# Patient Record
Sex: Male | Born: 1935 | Race: White | Hispanic: No | Smoking: Former smoker
Health system: Southern US, Community
[De-identification: ages and names within clinical notes are randomized; demographics above are authoritative.]

## PROBLEM LIST (undated history)

## (undated) DIAGNOSIS — F32A Depression, unspecified: Secondary | ICD-10-CM

## (undated) DIAGNOSIS — F22 Delusional disorders: Secondary | ICD-10-CM

## (undated) DIAGNOSIS — C4490 Unspecified malignant neoplasm of skin, unspecified: Secondary | ICD-10-CM

## (undated) DIAGNOSIS — F0390 Unspecified dementia without behavioral disturbance: Secondary | ICD-10-CM

## (undated) DIAGNOSIS — F319 Bipolar disorder, unspecified: Secondary | ICD-10-CM

## (undated) DIAGNOSIS — C3490 Malignant neoplasm of unspecified part of unspecified bronchus or lung: Secondary | ICD-10-CM

## (undated) DIAGNOSIS — N189 Chronic kidney disease, unspecified: Secondary | ICD-10-CM

## (undated) DIAGNOSIS — F418 Other specified anxiety disorders: Secondary | ICD-10-CM

## (undated) DIAGNOSIS — N400 Enlarged prostate without lower urinary tract symptoms: Secondary | ICD-10-CM

## (undated) DIAGNOSIS — C229 Malignant neoplasm of liver, not specified as primary or secondary: Secondary | ICD-10-CM

## (undated) DIAGNOSIS — I619 Nontraumatic intracerebral hemorrhage, unspecified: Secondary | ICD-10-CM

## (undated) HISTORY — DX: Malignant neoplasm of unspecified part of unspecified bronchus or lung: C34.90

## (undated) HISTORY — DX: Unspecified dementia, unspecified severity, without behavioral disturbance, psychotic disturbance, mood disturbance, and anxiety: F03.90

## (undated) HISTORY — DX: Unspecified malignant neoplasm of skin, unspecified: C44.90

## (undated) HISTORY — DX: Bipolar disorder, unspecified: F31.9

## (undated) HISTORY — DX: Malignant neoplasm of liver, not specified as primary or secondary: C22.9

## (undated) HISTORY — DX: Nontraumatic intracerebral hemorrhage, unspecified: I61.9

## (undated) HISTORY — PX: APPENDECTOMY: SHX54

## (undated) HISTORY — DX: Other specified anxiety disorders: F41.8

## (undated) HISTORY — DX: Delusional disorders: F22

## (undated) HISTORY — DX: Chronic kidney disease, unspecified: N18.9

## (undated) HISTORY — DX: Depression, unspecified: F32.A

## (undated) HISTORY — DX: Benign prostatic hyperplasia without lower urinary tract symptoms: N40.0

## (undated) HISTORY — PX: TONSILLECTOMY: SUR1361

---

## 2015-07-11 HISTORY — PX: OTHER SURGICAL HISTORY: SHX169

## 2019-09-12 ENCOUNTER — Other Ambulatory Visit: Payer: Self-pay | Admitting: Surgery

## 2019-09-12 DIAGNOSIS — K561 Intussusception: Secondary | ICD-10-CM

## 2019-09-17 ENCOUNTER — Other Ambulatory Visit: Payer: Self-pay | Admitting: Surgery

## 2019-09-17 MED ORDER — PREDNISONE 10 MG PO TABS: 50.0000 mg | ORAL_TABLET | ORAL | 0 refills | Status: DC

## 2019-09-17 MED ORDER — DIPHENHYDRAMINE HCL 50 MG PO TABS: 50.0000 mg | ORAL_TABLET | Freq: Once | ORAL | 0 refills | Status: AC

## 2019-09-18 ENCOUNTER — Other Ambulatory Visit: Payer: Self-pay | Admitting: Student

## 2019-09-18 DIAGNOSIS — K561 Intussusception: Secondary | ICD-10-CM

## 2019-09-25 ENCOUNTER — Other Ambulatory Visit: Payer: Self-pay | Admitting: Surgery

## 2019-09-25 ENCOUNTER — Ambulatory Visit (HOSPITAL_COMMUNITY)
Admission: RE | Admit: 2019-09-25 | Discharge: 2019-09-25 | Disposition: A | Discharge status: Home / Self Care | Payer: Medicare PPO | Source: Ambulatory Visit | Attending: Surgery | Admitting: Surgery

## 2019-09-25 ENCOUNTER — Other Ambulatory Visit: Payer: Self-pay

## 2019-09-25 DIAGNOSIS — K561 Intussusception: Secondary | ICD-10-CM

## 2019-09-25 LAB — POCT I-STAT CREATININE: Creatinine, Ser: 2.2 mg/dL — ABNORMAL HIGH (ref 0.61–1.24)

## 2019-09-25 MED ORDER — IOHEXOL 9 MG/ML PO SOLN
ORAL | Status: AC
  Filled 2019-09-25: qty 1000

## 2020-06-17 ENCOUNTER — Ambulatory Visit: Payer: Medicare PPO | Admitting: Podiatry

## 2020-06-24 ENCOUNTER — Encounter: Payer: Self-pay | Admitting: Podiatry

## 2020-06-24 ENCOUNTER — Ambulatory Visit: Payer: Medicare PPO | Admitting: Podiatry

## 2020-06-24 ENCOUNTER — Other Ambulatory Visit: Payer: Self-pay

## 2020-06-24 DIAGNOSIS — L600 Ingrowing nail: Secondary | ICD-10-CM | POA: Diagnosis not present

## 2020-06-24 MED ORDER — NEOMYCIN-POLYMYXIN-HC 3.5-10000-1 OT SUSP: OTIC | 0 refills | Status: DC

## 2020-06-24 NOTE — Progress Notes (Signed)
  Subjective:  Patient ID: George Cross, male    DOB: [birth year],  MRN: [id number]  Chief Complaint  Patient presents with  . Nail Problem       (NP)INGROWN TOENAIL; BIL//RFC    86 y.o. male presents with the above complaint. History confirmed with patient.  He has had ingrown toenails and often gets them treated by pedicures.  He is here with his daughter today.  The right lateral and left medial and lateral borders are the most bothersome.  Previously had a right medial matricectomy by another podiatrist and Saul Fordyce which solve the problem.  Objective:  Physical Exam: warm, good capillary refill, no trophic changes or ulcerative lesions, normal DP and PT pulses and normal sensory exam.  Varicose veins present.  Ingrowing nail borders of right lateral and left medial and lateral borders.  No paronychia  Assessment:  No diagnosis found.   Plan:  Patient was evaluated and treated and all questions answered.    Ingrown Nail, bilaterally -Patient elects to proceed with minor surgery to remove ingrown toenail today. Consent reviewed and signed by patient. -Ingrown nail excised. See procedure note. -Educated on post-procedure care including soaking. Written instructions provided and reviewed. -Patient to follow up in 2 weeks for nail check.  Procedure: Excision of Ingrown Toenail Location: Bilateral 1st toe left medial and right medial and lateral nail borders. Anesthesia: Lidocaine 1% plain; 1.5 mL and Marcaine 0.5% plain; 1.5 mL, digital block. Skin Prep: Betadine. Dressing: Silvadene; telfa; dry, sterile, compression dressing. Technique: Following skin prep, the toe was exsanguinated and a tourniquet was secured at the base of the toe. The affected nail border was freed, split with a nail splitter, and excised. Chemical matrixectomy was then performed with phenol and irrigated out with alcohol. The tourniquet was then removed and sterile dressing applied. Disposition: Patient  tolerated procedure well. Patient to return in 2 weeks for follow-up.     Return in about 2 weeks (around 07/08/2020) for nail check bilateral.

## 2020-06-24 NOTE — Patient Instructions (Signed)

## 2020-07-15 ENCOUNTER — Other Ambulatory Visit: Payer: Self-pay

## 2020-07-15 ENCOUNTER — Ambulatory Visit (INDEPENDENT_AMBULATORY_CARE_PROVIDER_SITE_OTHER): Payer: Medicare PPO | Admitting: Podiatry

## 2020-07-15 ENCOUNTER — Encounter: Payer: Self-pay | Admitting: Podiatry

## 2020-07-15 DIAGNOSIS — L600 Ingrowing nail: Secondary | ICD-10-CM

## 2020-07-15 DIAGNOSIS — L539 Erythematous condition, unspecified: Secondary | ICD-10-CM | POA: Diagnosis not present

## 2020-07-15 MED ORDER — CEFADROXIL 500 MG PO CAPS: 500.0000 mg | ORAL_CAPSULE | Freq: Two times a day (BID) | ORAL | 0 refills | Status: AC

## 2020-07-15 NOTE — Progress Notes (Signed)
  Subjective:  Patient ID: George Cross, male    DOB: 1923-06-19,  MRN: [id number]  Chief Complaint  Patient presents with  . Nail Problem    Bilateral ingrown nail. Left hallux has a little redness.     87 y.o. male presents with the above complaint. History confirmed with patient.  Status post permanent partial nail matricectomy's left bilateral and right lateral borders hallux.  Having some redness persistent to the level of the MTPJ and the left  Objective:  Physical Exam: warm, good capillary refill, no trophic changes or ulcerative lesions, normal DP and PT pulses and normal sensory exam.  Varicose veins present.  Right side is healing well.  There is persistent erythema to the level of the MTPJ on the left side.  No purulent drainage.  No malodor.  No recurrent ingrown.  Assessment:  No diagnosis found.   Plan:  Patient was evaluated and treated and all questions answered.    Ingrown Nail, bilaterally -Suspect the majority of this is persistent reaction to phenol.  Advised to continue soaking and Cortisporin ointment for 1 more week.  I do think it be helpful to have short course of antibiotics.  Duricef x5 days was sent to his pharmacy.  I will see him in 4 weeks to reevaluate if they are still having issues   No follow-ups on file.

## 2020-08-12 ENCOUNTER — Ambulatory Visit: Payer: Medicare PPO | Admitting: Podiatry

## 2020-08-30 DIAGNOSIS — F319 Bipolar disorder, unspecified: Secondary | ICD-10-CM | POA: Diagnosis not present

## 2020-08-30 DIAGNOSIS — R350 Frequency of micturition: Secondary | ICD-10-CM | POA: Diagnosis not present

## 2020-08-30 DIAGNOSIS — L309 Dermatitis, unspecified: Secondary | ICD-10-CM | POA: Diagnosis not present

## 2020-08-30 DIAGNOSIS — F688 Other specified disorders of adult personality and behavior: Secondary | ICD-10-CM | POA: Diagnosis not present

## 2020-09-13 DIAGNOSIS — F319 Bipolar disorder, unspecified: Secondary | ICD-10-CM | POA: Diagnosis not present

## 2020-09-13 DIAGNOSIS — F418 Other specified anxiety disorders: Secondary | ICD-10-CM | POA: Diagnosis not present

## 2020-09-13 DIAGNOSIS — M545 Low back pain, unspecified: Secondary | ICD-10-CM | POA: Diagnosis not present

## 2020-09-13 DIAGNOSIS — F688 Other specified disorders of adult personality and behavior: Secondary | ICD-10-CM | POA: Diagnosis not present

## 2020-09-13 DIAGNOSIS — R4182 Altered mental status, unspecified: Secondary | ICD-10-CM | POA: Diagnosis not present

## 2020-09-14 ENCOUNTER — Encounter: Payer: Self-pay | Admitting: *Deleted

## 2020-09-14 ENCOUNTER — Other Ambulatory Visit: Payer: Self-pay | Admitting: Family Medicine

## 2020-09-14 ENCOUNTER — Ambulatory Visit
Admission: RE | Admit: 2020-09-14 | Discharge: 2020-09-14 | Disposition: A | Discharge status: Home / Self Care | Payer: Medicare PPO | Source: Ambulatory Visit | Attending: Family Medicine | Admitting: Family Medicine

## 2020-09-14 ENCOUNTER — Other Ambulatory Visit: Payer: Self-pay

## 2020-09-14 DIAGNOSIS — Z8659 Personal history of other mental and behavioral disorders: Secondary | ICD-10-CM

## 2020-09-14 DIAGNOSIS — R443 Hallucinations, unspecified: Secondary | ICD-10-CM | POA: Diagnosis not present

## 2020-09-15 ENCOUNTER — Ambulatory Visit: Payer: Medicare PPO | Admitting: Diagnostic Neuroimaging

## 2020-09-15 ENCOUNTER — Encounter: Payer: Self-pay | Admitting: Diagnostic Neuroimaging

## 2020-09-15 VITALS — BP 103/56 | HR 95 | Ht 70.5 in | Wt 132.0 lb

## 2020-09-15 DIAGNOSIS — F0390 Unspecified dementia without behavioral disturbance: Secondary | ICD-10-CM

## 2020-09-15 DIAGNOSIS — F3164 Bipolar disorder, current episode mixed, severe, with psychotic features: Secondary | ICD-10-CM

## 2020-09-15 DIAGNOSIS — F03A0 Unspecified dementia, mild, without behavioral disturbance, psychotic disturbance, mood disturbance, and anxiety: Secondary | ICD-10-CM

## 2020-09-15 NOTE — Progress Notes (Signed)
[facility]  PATIENT: George Cross DOB: 1922/11/07  REFERRING CLINICIAN: London Pepper, MD HISTORY FROM: patient, daughter REASON FOR VISIT: new consult    HISTORICAL  CHIEF COMPLAINT:  Chief Complaint  Patient presents with  . Dementia    Rm 6 New Pt dgtr- Sandra/ HC-POA  MMSE 18    HISTORY OF PRESENT ILLNESS:   87 year old male here for evaluation of confusion, paranoia, memory loss, agitation, anxiety.  Patient has long history of bipolar disorder, with significant periods of mania and some periods of dysthymia and depression.  He was managed medically for many years by psychiatry with good control.  In 2016 patient had a fall resulting in subdural hematoma.  Medications were reviewed and polypharmacy was thought to be a cause.  Around that time he was weaned off of his psychiatry medications for bipolar disorder.  He did well for several years until about a year ago.  2021 patient had some cognitive and physical decline, having more trouble maintaining his ADLs outside of the home.  Patient moved closer to his daughter and moved into independent living [place] states.  In spite of this decline he was still doing well managing his medications and living independently with support from his family.  3 to 4 weeks ago he met a "girlfriend" where he lives and which led to some physical intimacy; however, something apparently happened and since that time patient has a felt very guilty and embarrassed about the situation.  This seems to have triggered his bipolar disorder significantly.  He has having high levels of paranoia, anxiety, confusion and agitation.  He went to PCP for evaluation.  He was referred to psychiatry and neurology.    REVIEW OF SYSTEMS: Full 14 system review of systems performed and negative with exception of: as per HPI.  ALLERGIES: Allergies  Allergen Reactions  . Sulfa Antibiotics   . Sulfamethoxazole   . Elemental Sulfur Rash     HOME MEDICATIONS: Outpatient Medications Prior to Visit  Medication Sig Dispense Refill  . diphenhydrAMINE (BENADRYL) 50 MG tablet Take 1 tablet (50 mg total) by mouth once for 1 dose. Take 1 tablet one hour before the CT scan 1 tablet 0  . LORazepam (ATIVAN) 1 MG tablet Take by mouth. (Patient not taking: Reported on 09/15/2020)    . neomycin-polymyxin-hydrocortisone (CORTISPORIN) 3.5-10000-1 OTIC suspension Apply 1-2 drops daily after soaking and cover with bandaid (Patient not taking: Reported on 09/15/2020) 10 mL 0  . predniSONE (DELTASONE) 10 MG tablet Take 5 tablets (50 mg total) by mouth as directed. Take 5 tablets 13 hours before the procedure, 7 hours before the procedure, and 1 hour before the procedure. 15 tablet 0   No facility-administered medications prior to visit.    PAST MEDICAL HISTORY: Past Medical History:  Diagnosis Date  . Bipolar 1 disorder ([place])   . BPH (benign prostatic hyperplasia)   . Cerebral hemorrhage ([place])   . CKD (chronic kidney disease)   . Dementia ([place])   . Depression   . Liver cancer ([place])   . Lung cancer ([place])   . Paranoid ([place])   . Situational anxiety   . Skin cancer    basal cell    PAST SURGICAL HISTORY: Past Surgical History:  Procedure Laterality Date  . APPENDECTOMY    . head surgery  2017   to release fluid in head s/p fall  . TONSILLECTOMY      FAMILY HISTORY: Family History  Problem Relation Age of Onset  .  Alzheimer's disease Sister     SOCIAL HISTORY: Social History   Socioeconomic History  . Marital status: Divorced    Spouse name: Not on file  . Number of children: 2  . Years of education: Not on file  . Highest education level: Master's degree (e.g., MA, MS, MEng, MEd, MSW, MBA)  Occupational History    Comment: retired  Tobacco Use  . Smoking status: Former Smoker    Quit date: 09/15/2010    Years since quitting: 10.0  . Smokeless tobacco: Never Used  Substance and Sexual Activity  . Alcohol use: Not  Currently  . Drug use: Never  . Sexual activity: Not on file  Other Topics Concern  . Not on file  Social History Narrative   09/14/20 lives at [facility], independent living   Social Determinants of Health   Financial Resource Strain: Not on file  Food Insecurity: Not on file  Transportation Needs: Not on file  Physical Activity: Not on file  Stress: Not on file  Social Connections: Not on file  Intimate Partner Violence: Not on file     PHYSICAL EXAM  GENERAL EXAM/CONSTITUTIONAL: Vitals:  Vitals:   09/15/20 1035  BP: (!) 103/56  Pulse: 95  Weight: 132 lb (59.9 kg)  Height: 5' 10.5" (1.791 m)     Body mass index is 18.67 kg/m. Wt Readings from Last 3 Encounters:  09/15/20 132 lb (59.9 kg)     Patient is in no distress; well developed, nourished and groomed; neck is supple  CARDIOVASCULAR:  Examination of carotid arteries is normal; no carotid bruits  Regular rate and rhythm, no murmurs  Examination of peripheral vascular system by observation and palpation is normal  EYES:  Ophthalmoscopic exam of optic discs and posterior segments is normal; no papilledema or hemorrhages  No exam data present  MUSCULOSKELETAL:  Gait, strength, tone, movements noted in Neurologic exam below  NEUROLOGIC: MENTAL STATUS:  MMSE - [place] Exam 09/15/2020  Orientation to time 3  Orientation to Place 3  Registration 3  Attention/ Calculation 2  Recall 0  Language- name 2 objects 2  Language- repeat 0  Language- follow 3 step command 3  Language- read & follow direction 1  Write a sentence 1  Copy design 0  Total score 18    awake, alert, oriented to person  [place] attention and concentration  language fluent, comprehension intact, naming intact  fund of knowledge appropriate  ANXIOUS, TANGENTIAL, RESTLESS  CRANIAL NERVE:   2nd - no papilledema on fundoscopic exam  2nd, 3rd, 4th, 6th - pupils equal and reactive to light,  visual fields full to confrontation, extraocular muscles intact, no nystagmus  5th - facial sensation symmetric  7th - facial strength symmetric  8th - hearing intact  9th - palate elevates symmetrically, uvula midline  11th - shoulder shrug symmetric  12th - tongue protrusion midline  MOTOR:   normal bulk and tone, DIFFUSE 4+ strength in the BUE, BLE  SENSORY:   normal and symmetric to light touch, temperature, vibration  COORDINATION:   finger-nose-finger, fine finger movements SLOW   REFLEXES:   deep tendon reflexes 1+ and symmetric  GAIT/STATION:   narrow based gait; USING WALKER     DIAGNOSTIC DATA (LABS, IMAGING, TESTING) - I reviewed patient records, labs, notes, testing and imaging myself where available.  No results found for: WBC, HGB, HCT, MCV, PLT    Component Value Date/Time   CREATININE 2.20 (H) 09/25/2019  0846   No results found for: CHOL, HDL, LDLCALC, LDLDIRECT, TRIG, CHOLHDL No results found for: HGBA1C No results found for: VITAMINB12 No results found for: TSH   09/15/2020 CT head [I reviewed images myself and agree with interpretation. -VRP] - No acute abnormality. Atrophy and chronic microvascular ischemic change in the white matter. Bilateral craniotomy.     ASSESSMENT AND PLAN  87 y.o. year old male here with long history of bipolar disorder, previously managed medically, now off medications for several years, now in exacerbation due to situational stress about 3 to 4 weeks ago.  Also with 1 year of gradual onset and progression of cognitive and ADL decline, raising possibility of underlying mild dementia.  Dx:  1. Bipolar disorder, current episode mixed, severe, with psychotic features ([place])   2. Mild dementia ([place])      PLAN:  BIPOLAR DISORDER (main current active issue; since Feb 2022) - follow up with psychiatry (sxs exacerbated in last 3-4 weeks due to situational stress) - may need to restart prior bipolar  medications   MEMORY / COGNITIVE DECLINE (gradual onset since 2021; MMSE 18/30; decline in ADLs) - suspect onset of mild neurodegenerative dementia (since at least 2021) - safety / supervision issues reviewed; may need higher level of care in the coming weeks / months - daily physical activity / exercise (at least 15-30 minutes) - eat more plants / vegetables - increase social activities, brain stimulation, games, puzzles, hobbies, crafts, arts, music - aim for at least 7-8 hours sleep per night (or more) - avoid smoking and alcohol - caregiver resources provided - caution with medications, finances, living alone  Return for pending if symptoms worsen or fail to improve.    Penni Bombard, MD 09/14/9442, 61:90 AM Certified in Neurology, Neurophysiology and Neuroimaging  [facility] [address] [place], [place] [address] [phone]

## 2020-09-15 NOTE — Patient Instructions (Signed)
MEMORY / COGNITIVE DECLINE (MMSE 18/30; decline in ADLs) - suspect onset of dementia (since at least 2021) - safety / supervision issues reviewed - daily physical activity / exercise (at least 15-30 minutes) - eat more plants / vegetables - increase social activities, brain stimulation, games, puzzles, hobbies, crafts, arts, music - aim for at least 7-8 hours sleep per night (or more) - avoid smoking and alcohol - caution with medications, finances, living alone  BIPOLAR DISORDER - follow up with psychiatry (worsened in last 3-4 weeks due to situational stress)

## 2020-10-26 DIAGNOSIS — E08311 Diabetes mellitus due to underlying condition with unspecified diabetic retinopathy with macular edema: Secondary | ICD-10-CM | POA: Diagnosis not present

## 2020-10-26 DIAGNOSIS — E559 Vitamin D deficiency, unspecified: Secondary | ICD-10-CM | POA: Diagnosis not present

## 2020-10-26 DIAGNOSIS — D519 Vitamin B12 deficiency anemia, unspecified: Secondary | ICD-10-CM | POA: Diagnosis not present

## 2020-10-26 DIAGNOSIS — R4182 Altered mental status, unspecified: Secondary | ICD-10-CM | POA: Diagnosis not present

## 2020-10-26 DIAGNOSIS — D508 Other iron deficiency anemias: Secondary | ICD-10-CM | POA: Diagnosis not present

## 2020-10-26 DIAGNOSIS — R972 Elevated prostate specific antigen [PSA]: Secondary | ICD-10-CM | POA: Diagnosis not present

## 2020-10-26 DIAGNOSIS — D518 Other vitamin B12 deficiency anemias: Secondary | ICD-10-CM | POA: Diagnosis not present

## 2020-10-26 DIAGNOSIS — D509 Iron deficiency anemia, unspecified: Secondary | ICD-10-CM | POA: Diagnosis not present

## 2020-10-28 DIAGNOSIS — G255 Other chorea: Secondary | ICD-10-CM | POA: Diagnosis not present

## 2020-10-28 DIAGNOSIS — F418 Other specified anxiety disorders: Secondary | ICD-10-CM | POA: Diagnosis not present

## 2020-10-28 DIAGNOSIS — N400 Enlarged prostate without lower urinary tract symptoms: Secondary | ICD-10-CM | POA: Diagnosis not present

## 2020-10-28 DIAGNOSIS — H3589 Other specified retinal disorders: Secondary | ICD-10-CM | POA: Diagnosis not present

## 2020-10-28 DIAGNOSIS — N3281 Overactive bladder: Secondary | ICD-10-CM | POA: Diagnosis not present

## 2020-10-28 DIAGNOSIS — F1011 Alcohol abuse, in remission: Secondary | ICD-10-CM | POA: Diagnosis not present

## 2020-10-28 DIAGNOSIS — R634 Abnormal weight loss: Secondary | ICD-10-CM | POA: Diagnosis not present

## 2020-10-28 DIAGNOSIS — N189 Chronic kidney disease, unspecified: Secondary | ICD-10-CM | POA: Diagnosis not present

## 2020-10-28 DIAGNOSIS — Z9989 Dependence on other enabling machines and devices: Secondary | ICD-10-CM | POA: Diagnosis not present

## 2020-11-04 DIAGNOSIS — E568 Deficiency of other vitamins: Secondary | ICD-10-CM | POA: Diagnosis not present

## 2020-11-04 DIAGNOSIS — F413 Other mixed anxiety disorders: Secondary | ICD-10-CM | POA: Diagnosis not present

## 2020-11-04 DIAGNOSIS — F1011 Alcohol abuse, in remission: Secondary | ICD-10-CM | POA: Diagnosis not present

## 2020-11-04 DIAGNOSIS — R634 Abnormal weight loss: Secondary | ICD-10-CM | POA: Diagnosis not present

## 2020-11-04 DIAGNOSIS — F32A Depression, unspecified: Secondary | ICD-10-CM | POA: Diagnosis not present

## 2020-11-04 DIAGNOSIS — F3189 Other bipolar disorder: Secondary | ICD-10-CM | POA: Diagnosis not present

## 2020-11-04 DIAGNOSIS — N400 Enlarged prostate without lower urinary tract symptoms: Secondary | ICD-10-CM | POA: Diagnosis not present

## 2020-11-04 DIAGNOSIS — F418 Other specified anxiety disorders: Secondary | ICD-10-CM | POA: Diagnosis not present

## 2020-11-05 DIAGNOSIS — N400 Enlarged prostate without lower urinary tract symptoms: Secondary | ICD-10-CM | POA: Diagnosis not present

## 2020-11-05 DIAGNOSIS — F419 Anxiety disorder, unspecified: Secondary | ICD-10-CM | POA: Diagnosis not present

## 2020-11-05 DIAGNOSIS — H3530 Unspecified macular degeneration: Secondary | ICD-10-CM | POA: Diagnosis not present

## 2020-11-05 DIAGNOSIS — R634 Abnormal weight loss: Secondary | ICD-10-CM | POA: Diagnosis not present

## 2020-11-05 DIAGNOSIS — N3281 Overactive bladder: Secondary | ICD-10-CM | POA: Diagnosis not present

## 2020-11-05 DIAGNOSIS — Z9181 History of falling: Secondary | ICD-10-CM | POA: Diagnosis not present

## 2020-11-05 DIAGNOSIS — M170 Bilateral primary osteoarthritis of knee: Secondary | ICD-10-CM | POA: Diagnosis not present

## 2020-11-05 DIAGNOSIS — Z681 Body mass index (BMI) 19 or less, adult: Secondary | ICD-10-CM | POA: Diagnosis not present

## 2020-11-05 DIAGNOSIS — F319 Bipolar disorder, unspecified: Secondary | ICD-10-CM | POA: Diagnosis not present

## 2020-11-09 DIAGNOSIS — Z681 Body mass index (BMI) 19 or less, adult: Secondary | ICD-10-CM | POA: Diagnosis not present

## 2020-11-09 DIAGNOSIS — M170 Bilateral primary osteoarthritis of knee: Secondary | ICD-10-CM | POA: Diagnosis not present

## 2020-11-09 DIAGNOSIS — F419 Anxiety disorder, unspecified: Secondary | ICD-10-CM | POA: Diagnosis not present

## 2020-11-09 DIAGNOSIS — R634 Abnormal weight loss: Secondary | ICD-10-CM | POA: Diagnosis not present

## 2020-11-09 DIAGNOSIS — Z9181 History of falling: Secondary | ICD-10-CM | POA: Diagnosis not present

## 2020-11-09 DIAGNOSIS — N400 Enlarged prostate without lower urinary tract symptoms: Secondary | ICD-10-CM | POA: Diagnosis not present

## 2020-11-09 DIAGNOSIS — N3281 Overactive bladder: Secondary | ICD-10-CM | POA: Diagnosis not present

## 2020-11-09 DIAGNOSIS — H3530 Unspecified macular degeneration: Secondary | ICD-10-CM | POA: Diagnosis not present

## 2020-11-09 DIAGNOSIS — F319 Bipolar disorder, unspecified: Secondary | ICD-10-CM | POA: Diagnosis not present

## 2020-11-11 DIAGNOSIS — R634 Abnormal weight loss: Secondary | ICD-10-CM | POA: Diagnosis not present

## 2020-11-11 DIAGNOSIS — N3281 Overactive bladder: Secondary | ICD-10-CM | POA: Diagnosis not present

## 2020-11-11 DIAGNOSIS — M170 Bilateral primary osteoarthritis of knee: Secondary | ICD-10-CM | POA: Diagnosis not present

## 2020-11-11 DIAGNOSIS — Z681 Body mass index (BMI) 19 or less, adult: Secondary | ICD-10-CM | POA: Diagnosis not present

## 2020-11-11 DIAGNOSIS — F419 Anxiety disorder, unspecified: Secondary | ICD-10-CM | POA: Diagnosis not present

## 2020-11-11 DIAGNOSIS — N400 Enlarged prostate without lower urinary tract symptoms: Secondary | ICD-10-CM | POA: Diagnosis not present

## 2020-11-11 DIAGNOSIS — H3530 Unspecified macular degeneration: Secondary | ICD-10-CM | POA: Diagnosis not present

## 2020-11-11 DIAGNOSIS — Z9189 Other specified personal risk factors, not elsewhere classified: Secondary | ICD-10-CM | POA: Diagnosis not present

## 2020-11-11 DIAGNOSIS — F418 Other specified anxiety disorders: Secondary | ICD-10-CM | POA: Diagnosis not present

## 2020-11-11 DIAGNOSIS — F319 Bipolar disorder, unspecified: Secondary | ICD-10-CM | POA: Diagnosis not present

## 2020-11-11 DIAGNOSIS — R143 Flatulence: Secondary | ICD-10-CM | POA: Diagnosis not present

## 2020-11-11 DIAGNOSIS — Z9181 History of falling: Secondary | ICD-10-CM | POA: Diagnosis not present

## 2020-11-16 DIAGNOSIS — Z9181 History of falling: Secondary | ICD-10-CM | POA: Diagnosis not present

## 2020-11-16 DIAGNOSIS — R634 Abnormal weight loss: Secondary | ICD-10-CM | POA: Diagnosis not present

## 2020-11-16 DIAGNOSIS — N3281 Overactive bladder: Secondary | ICD-10-CM | POA: Diagnosis not present

## 2020-11-16 DIAGNOSIS — F319 Bipolar disorder, unspecified: Secondary | ICD-10-CM | POA: Diagnosis not present

## 2020-11-16 DIAGNOSIS — M170 Bilateral primary osteoarthritis of knee: Secondary | ICD-10-CM | POA: Diagnosis not present

## 2020-11-16 DIAGNOSIS — Z681 Body mass index (BMI) 19 or less, adult: Secondary | ICD-10-CM | POA: Diagnosis not present

## 2020-11-16 DIAGNOSIS — H3530 Unspecified macular degeneration: Secondary | ICD-10-CM | POA: Diagnosis not present

## 2020-11-16 DIAGNOSIS — F419 Anxiety disorder, unspecified: Secondary | ICD-10-CM | POA: Diagnosis not present

## 2020-11-16 DIAGNOSIS — N400 Enlarged prostate without lower urinary tract symptoms: Secondary | ICD-10-CM | POA: Diagnosis not present

## 2020-11-17 DIAGNOSIS — Z9181 History of falling: Secondary | ICD-10-CM | POA: Diagnosis not present

## 2020-11-17 DIAGNOSIS — Z681 Body mass index (BMI) 19 or less, adult: Secondary | ICD-10-CM | POA: Diagnosis not present

## 2020-11-17 DIAGNOSIS — H3530 Unspecified macular degeneration: Secondary | ICD-10-CM | POA: Diagnosis not present

## 2020-11-17 DIAGNOSIS — N400 Enlarged prostate without lower urinary tract symptoms: Secondary | ICD-10-CM | POA: Diagnosis not present

## 2020-11-17 DIAGNOSIS — F419 Anxiety disorder, unspecified: Secondary | ICD-10-CM | POA: Diagnosis not present

## 2020-11-17 DIAGNOSIS — F319 Bipolar disorder, unspecified: Secondary | ICD-10-CM | POA: Diagnosis not present

## 2020-11-17 DIAGNOSIS — R634 Abnormal weight loss: Secondary | ICD-10-CM | POA: Diagnosis not present

## 2020-11-17 DIAGNOSIS — M170 Bilateral primary osteoarthritis of knee: Secondary | ICD-10-CM | POA: Diagnosis not present

## 2020-11-17 DIAGNOSIS — N3281 Overactive bladder: Secondary | ICD-10-CM | POA: Diagnosis not present

## 2020-11-18 DIAGNOSIS — Z9181 History of falling: Secondary | ICD-10-CM | POA: Diagnosis not present

## 2020-11-18 DIAGNOSIS — N400 Enlarged prostate without lower urinary tract symptoms: Secondary | ICD-10-CM | POA: Diagnosis not present

## 2020-11-18 DIAGNOSIS — R634 Abnormal weight loss: Secondary | ICD-10-CM | POA: Diagnosis not present

## 2020-11-18 DIAGNOSIS — F419 Anxiety disorder, unspecified: Secondary | ICD-10-CM | POA: Diagnosis not present

## 2020-11-18 DIAGNOSIS — N3281 Overactive bladder: Secondary | ICD-10-CM | POA: Diagnosis not present

## 2020-11-18 DIAGNOSIS — F319 Bipolar disorder, unspecified: Secondary | ICD-10-CM | POA: Diagnosis not present

## 2020-11-18 DIAGNOSIS — H3530 Unspecified macular degeneration: Secondary | ICD-10-CM | POA: Diagnosis not present

## 2020-11-18 DIAGNOSIS — M170 Bilateral primary osteoarthritis of knee: Secondary | ICD-10-CM | POA: Diagnosis not present

## 2020-11-18 DIAGNOSIS — Z681 Body mass index (BMI) 19 or less, adult: Secondary | ICD-10-CM | POA: Diagnosis not present

## 2020-11-23 DIAGNOSIS — N3281 Overactive bladder: Secondary | ICD-10-CM | POA: Diagnosis not present

## 2020-11-23 DIAGNOSIS — F319 Bipolar disorder, unspecified: Secondary | ICD-10-CM | POA: Diagnosis not present

## 2020-11-23 DIAGNOSIS — H3530 Unspecified macular degeneration: Secondary | ICD-10-CM | POA: Diagnosis not present

## 2020-11-23 DIAGNOSIS — M170 Bilateral primary osteoarthritis of knee: Secondary | ICD-10-CM | POA: Diagnosis not present

## 2020-11-23 DIAGNOSIS — F419 Anxiety disorder, unspecified: Secondary | ICD-10-CM | POA: Diagnosis not present

## 2020-11-23 DIAGNOSIS — Z9181 History of falling: Secondary | ICD-10-CM | POA: Diagnosis not present

## 2020-11-23 DIAGNOSIS — N400 Enlarged prostate without lower urinary tract symptoms: Secondary | ICD-10-CM | POA: Diagnosis not present

## 2020-11-23 DIAGNOSIS — Z681 Body mass index (BMI) 19 or less, adult: Secondary | ICD-10-CM | POA: Diagnosis not present

## 2020-11-23 DIAGNOSIS — R634 Abnormal weight loss: Secondary | ICD-10-CM | POA: Diagnosis not present

## 2020-11-24 DIAGNOSIS — N3281 Overactive bladder: Secondary | ICD-10-CM | POA: Diagnosis not present

## 2020-11-24 DIAGNOSIS — R634 Abnormal weight loss: Secondary | ICD-10-CM | POA: Diagnosis not present

## 2020-11-24 DIAGNOSIS — Z9181 History of falling: Secondary | ICD-10-CM | POA: Diagnosis not present

## 2020-11-24 DIAGNOSIS — N400 Enlarged prostate without lower urinary tract symptoms: Secondary | ICD-10-CM | POA: Diagnosis not present

## 2020-11-24 DIAGNOSIS — F419 Anxiety disorder, unspecified: Secondary | ICD-10-CM | POA: Diagnosis not present

## 2020-11-24 DIAGNOSIS — M170 Bilateral primary osteoarthritis of knee: Secondary | ICD-10-CM | POA: Diagnosis not present

## 2020-11-24 DIAGNOSIS — Z681 Body mass index (BMI) 19 or less, adult: Secondary | ICD-10-CM | POA: Diagnosis not present

## 2020-11-24 DIAGNOSIS — F319 Bipolar disorder, unspecified: Secondary | ICD-10-CM | POA: Diagnosis not present

## 2020-11-24 DIAGNOSIS — H3530 Unspecified macular degeneration: Secondary | ICD-10-CM | POA: Diagnosis not present

## 2020-11-25 DIAGNOSIS — N400 Enlarged prostate without lower urinary tract symptoms: Secondary | ICD-10-CM | POA: Diagnosis not present

## 2020-11-25 DIAGNOSIS — Z681 Body mass index (BMI) 19 or less, adult: Secondary | ICD-10-CM | POA: Diagnosis not present

## 2020-11-25 DIAGNOSIS — F419 Anxiety disorder, unspecified: Secondary | ICD-10-CM | POA: Diagnosis not present

## 2020-11-25 DIAGNOSIS — M170 Bilateral primary osteoarthritis of knee: Secondary | ICD-10-CM | POA: Diagnosis not present

## 2020-11-25 DIAGNOSIS — N3281 Overactive bladder: Secondary | ICD-10-CM | POA: Diagnosis not present

## 2020-11-25 DIAGNOSIS — H3530 Unspecified macular degeneration: Secondary | ICD-10-CM | POA: Diagnosis not present

## 2020-11-25 DIAGNOSIS — R143 Flatulence: Secondary | ICD-10-CM | POA: Diagnosis not present

## 2020-11-25 DIAGNOSIS — Z9181 History of falling: Secondary | ICD-10-CM | POA: Diagnosis not present

## 2020-11-25 DIAGNOSIS — F319 Bipolar disorder, unspecified: Secondary | ICD-10-CM | POA: Diagnosis not present

## 2020-11-25 DIAGNOSIS — R634 Abnormal weight loss: Secondary | ICD-10-CM | POA: Diagnosis not present

## 2020-11-25 DIAGNOSIS — R195 Other fecal abnormalities: Secondary | ICD-10-CM | POA: Diagnosis not present

## 2020-11-30 DIAGNOSIS — N400 Enlarged prostate without lower urinary tract symptoms: Secondary | ICD-10-CM | POA: Diagnosis not present

## 2020-11-30 DIAGNOSIS — H3530 Unspecified macular degeneration: Secondary | ICD-10-CM | POA: Diagnosis not present

## 2020-11-30 DIAGNOSIS — Z9181 History of falling: Secondary | ICD-10-CM | POA: Diagnosis not present

## 2020-11-30 DIAGNOSIS — Z681 Body mass index (BMI) 19 or less, adult: Secondary | ICD-10-CM | POA: Diagnosis not present

## 2020-11-30 DIAGNOSIS — N3281 Overactive bladder: Secondary | ICD-10-CM | POA: Diagnosis not present

## 2020-11-30 DIAGNOSIS — F419 Anxiety disorder, unspecified: Secondary | ICD-10-CM | POA: Diagnosis not present

## 2020-11-30 DIAGNOSIS — R634 Abnormal weight loss: Secondary | ICD-10-CM | POA: Diagnosis not present

## 2020-11-30 DIAGNOSIS — F319 Bipolar disorder, unspecified: Secondary | ICD-10-CM | POA: Diagnosis not present

## 2020-11-30 DIAGNOSIS — M170 Bilateral primary osteoarthritis of knee: Secondary | ICD-10-CM | POA: Diagnosis not present

## 2020-12-03 DIAGNOSIS — N400 Enlarged prostate without lower urinary tract symptoms: Secondary | ICD-10-CM | POA: Diagnosis not present

## 2020-12-03 DIAGNOSIS — Z681 Body mass index (BMI) 19 or less, adult: Secondary | ICD-10-CM | POA: Diagnosis not present

## 2020-12-03 DIAGNOSIS — F32A Depression, unspecified: Secondary | ICD-10-CM | POA: Diagnosis not present

## 2020-12-03 DIAGNOSIS — Z9181 History of falling: Secondary | ICD-10-CM | POA: Diagnosis not present

## 2020-12-03 DIAGNOSIS — N3281 Overactive bladder: Secondary | ICD-10-CM | POA: Diagnosis not present

## 2020-12-03 DIAGNOSIS — M170 Bilateral primary osteoarthritis of knee: Secondary | ICD-10-CM | POA: Diagnosis not present

## 2020-12-03 DIAGNOSIS — R634 Abnormal weight loss: Secondary | ICD-10-CM | POA: Diagnosis not present

## 2020-12-03 DIAGNOSIS — F413 Other mixed anxiety disorders: Secondary | ICD-10-CM | POA: Diagnosis not present

## 2020-12-03 DIAGNOSIS — F319 Bipolar disorder, unspecified: Secondary | ICD-10-CM | POA: Diagnosis not present

## 2020-12-03 DIAGNOSIS — F3189 Other bipolar disorder: Secondary | ICD-10-CM | POA: Diagnosis not present

## 2020-12-03 DIAGNOSIS — H3530 Unspecified macular degeneration: Secondary | ICD-10-CM | POA: Diagnosis not present

## 2020-12-03 DIAGNOSIS — F419 Anxiety disorder, unspecified: Secondary | ICD-10-CM | POA: Diagnosis not present

## 2020-12-07 DIAGNOSIS — F419 Anxiety disorder, unspecified: Secondary | ICD-10-CM | POA: Diagnosis not present

## 2020-12-07 DIAGNOSIS — M170 Bilateral primary osteoarthritis of knee: Secondary | ICD-10-CM | POA: Diagnosis not present

## 2020-12-07 DIAGNOSIS — F319 Bipolar disorder, unspecified: Secondary | ICD-10-CM | POA: Diagnosis not present

## 2020-12-07 DIAGNOSIS — Z9181 History of falling: Secondary | ICD-10-CM | POA: Diagnosis not present

## 2020-12-07 DIAGNOSIS — H3530 Unspecified macular degeneration: Secondary | ICD-10-CM | POA: Diagnosis not present

## 2020-12-07 DIAGNOSIS — N400 Enlarged prostate without lower urinary tract symptoms: Secondary | ICD-10-CM | POA: Diagnosis not present

## 2020-12-07 DIAGNOSIS — Z681 Body mass index (BMI) 19 or less, adult: Secondary | ICD-10-CM | POA: Diagnosis not present

## 2020-12-07 DIAGNOSIS — N3281 Overactive bladder: Secondary | ICD-10-CM | POA: Diagnosis not present

## 2020-12-07 DIAGNOSIS — R634 Abnormal weight loss: Secondary | ICD-10-CM | POA: Diagnosis not present

## 2020-12-12 ENCOUNTER — Emergency Department (HOSPITAL_COMMUNITY)
Admission: EM | Admit: 2020-12-12 | Discharge: 2020-12-14 | Disposition: A | Discharge status: Home / Self Care | Payer: Medicare PPO | Attending: Emergency Medicine | Admitting: Emergency Medicine

## 2020-12-12 ENCOUNTER — Other Ambulatory Visit: Payer: Self-pay

## 2020-12-12 ENCOUNTER — Emergency Department (HOSPITAL_COMMUNITY): Payer: Medicare PPO

## 2020-12-12 ENCOUNTER — Encounter (HOSPITAL_COMMUNITY): Payer: Self-pay

## 2020-12-12 DIAGNOSIS — E860 Dehydration: Secondary | ICD-10-CM | POA: Insufficient documentation

## 2020-12-12 DIAGNOSIS — R5383 Other fatigue: Secondary | ICD-10-CM | POA: Diagnosis present

## 2020-12-12 DIAGNOSIS — Z20822 Contact with and (suspected) exposure to covid-19: Secondary | ICD-10-CM | POA: Insufficient documentation

## 2020-12-12 DIAGNOSIS — F0390 Unspecified dementia without behavioral disturbance: Secondary | ICD-10-CM | POA: Diagnosis not present

## 2020-12-12 DIAGNOSIS — Z85118 Personal history of other malignant neoplasm of bronchus and lung: Secondary | ICD-10-CM | POA: Diagnosis not present

## 2020-12-12 DIAGNOSIS — I959 Hypotension, unspecified: Secondary | ICD-10-CM | POA: Diagnosis not present

## 2020-12-12 DIAGNOSIS — Z8505 Personal history of malignant neoplasm of liver: Secondary | ICD-10-CM | POA: Insufficient documentation

## 2020-12-12 DIAGNOSIS — R609 Edema, unspecified: Secondary | ICD-10-CM | POA: Diagnosis not present

## 2020-12-12 DIAGNOSIS — Z87891 Personal history of nicotine dependence: Secondary | ICD-10-CM | POA: Insufficient documentation

## 2020-12-12 DIAGNOSIS — R5381 Other malaise: Secondary | ICD-10-CM | POA: Diagnosis not present

## 2020-12-12 DIAGNOSIS — I672 Cerebral atherosclerosis: Secondary | ICD-10-CM | POA: Diagnosis not present

## 2020-12-12 DIAGNOSIS — R600 Localized edema: Secondary | ICD-10-CM | POA: Insufficient documentation

## 2020-12-12 DIAGNOSIS — R410 Disorientation, unspecified: Secondary | ICD-10-CM | POA: Insufficient documentation

## 2020-12-12 DIAGNOSIS — R0602 Shortness of breath: Secondary | ICD-10-CM | POA: Diagnosis not present

## 2020-12-12 DIAGNOSIS — N189 Chronic kidney disease, unspecified: Secondary | ICD-10-CM | POA: Insufficient documentation

## 2020-12-12 DIAGNOSIS — Z85828 Personal history of other malignant neoplasm of skin: Secondary | ICD-10-CM | POA: Diagnosis not present

## 2020-12-12 DIAGNOSIS — R000 Tachycardia, unspecified: Secondary | ICD-10-CM | POA: Diagnosis not present

## 2020-12-12 DIAGNOSIS — I739 Peripheral vascular disease, unspecified: Secondary | ICD-10-CM | POA: Diagnosis not present

## 2020-12-12 LAB — CBC WITH DIFFERENTIAL/PLATELET
Abs Immature Granulocytes: 0.06 10*3/uL (ref 0.00–0.07)
Basophils Absolute: 0 10*3/uL (ref 0.0–0.1)
Basophils Relative: 0 %
Eosinophils Absolute: 0 10*3/uL (ref 0.0–0.5)
Eosinophils Relative: 0 %
HCT: 35.7 % — ABNORMAL LOW (ref 39.0–52.0)
Hemoglobin: 11.6 g/dL — ABNORMAL LOW (ref 13.0–17.0)
Immature Granulocytes: 1 %
Lymphocytes Relative: 4 %
Lymphs Abs: 0.5 10*3/uL — ABNORMAL LOW (ref 0.7–4.0)
MCH: 30.4 pg (ref 26.0–34.0)
MCHC: 32.5 g/dL (ref 30.0–36.0)
MCV: 93.5 fL (ref 80.0–100.0)
Monocytes Absolute: 0.8 10*3/uL (ref 0.1–1.0)
Monocytes Relative: 6 %
Neutro Abs: 11.1 10*3/uL — ABNORMAL HIGH (ref 1.7–7.7)
Neutrophils Relative %: 89 %
Platelets: 100 10*3/uL — ABNORMAL LOW (ref 150–400)
RBC: 3.82 MIL/uL — ABNORMAL LOW (ref 4.22–5.81)
RDW: 15.9 % — ABNORMAL HIGH (ref 11.5–15.5)
WBC: 12.5 10*3/uL — ABNORMAL HIGH (ref 4.0–10.5)
nRBC: 0 % (ref 0.0–0.2)

## 2020-12-12 LAB — URINALYSIS, ROUTINE W REFLEX MICROSCOPIC
Bacteria, UA: NONE SEEN
Bilirubin Urine: NEGATIVE
Glucose, UA: 150 mg/dL — AB
Ketones, ur: NEGATIVE mg/dL
Leukocytes,Ua: NEGATIVE
Nitrite: NEGATIVE
Protein, ur: 30 mg/dL — AB
Specific Gravity, Urine: 1.017 (ref 1.005–1.030)
pH: 5 (ref 5.0–8.0)

## 2020-12-12 LAB — COMPREHENSIVE METABOLIC PANEL
ALT: 64 U/L — ABNORMAL HIGH (ref 0–44)
AST: 74 U/L — ABNORMAL HIGH (ref 15–41)
Albumin: 3.2 g/dL — ABNORMAL LOW (ref 3.5–5.0)
Alkaline Phosphatase: 77 U/L (ref 38–126)
Anion gap: 8 (ref 5–15)
BUN: 75 mg/dL — ABNORMAL HIGH (ref 8–23)
CO2: 26 mmol/L (ref 22–32)
Calcium: 8.9 mg/dL (ref 8.9–10.3)
Chloride: 107 mmol/L (ref 98–111)
Creatinine, Ser: 2.46 mg/dL — ABNORMAL HIGH (ref 0.61–1.24)
GFR, Estimated: 24 mL/min — ABNORMAL LOW (ref >60)
Glucose, Bld: 120 mg/dL — ABNORMAL HIGH (ref 70–99)
Potassium: 3.2 mmol/L — ABNORMAL LOW (ref 3.5–5.1)
Sodium: 141 mmol/L (ref 135–145)
Total Bilirubin: 0.9 mg/dL (ref 0.3–1.2)
Total Protein: 5.5 g/dL — ABNORMAL LOW (ref 6.5–8.1)

## 2020-12-12 LAB — BRAIN NATRIURETIC PEPTIDE: B Natriuretic Peptide: 266.6 pg/mL — ABNORMAL HIGH (ref 0.0–100.0)

## 2020-12-12 LAB — RESP PANEL BY RT-PCR (FLU A&B, COVID) ARPGX2
Influenza A by PCR: NEGATIVE
Influenza B by PCR: NEGATIVE
SARS Coronavirus 2 by RT PCR: NEGATIVE

## 2020-12-12 MED ORDER — SODIUM CHLORIDE 0.9 % IV BOLUS
500.0000 mL | Freq: Once | INTRAVENOUS | Status: AC
  Administered 2020-12-12: 500 mL via INTRAVENOUS

## 2020-12-12 MED ORDER — LORAZEPAM 0.5 MG PO TABS
0.5000 mg | ORAL_TABLET | Freq: Four times a day (QID) | ORAL | Status: DC | PRN
  Administered 2020-12-14: 0.5 mg via ORAL
  Filled 2020-12-12 (×2): qty 1

## 2020-12-12 MED ORDER — VITAMIN D3 25 MCG (1000 UNIT) PO TABS
1000.0000 [IU] | ORAL_TABLET | Freq: Every day | ORAL | Status: DC
  Administered 2020-12-12 – 2020-12-14 (×3): 1000 [IU] via ORAL
  Filled 2020-12-12 (×4): qty 1

## 2020-12-12 MED ORDER — ESCITALOPRAM OXALATE 10 MG PO TABS
5.0000 mg | ORAL_TABLET | Freq: Every day | ORAL | Status: DC
  Administered 2020-12-12 – 2020-12-14 (×3): 5 mg via ORAL
  Filled 2020-12-12 (×3): qty 1

## 2020-12-12 MED ORDER — OXYBUTYNIN CHLORIDE ER 5 MG PO TB24
5.0000 mg | ORAL_TABLET | Freq: Every day | ORAL | Status: DC
  Administered 2020-12-13 (×2): 5 mg via ORAL
  Filled 2020-12-12 (×4): qty 1

## 2020-12-12 MED ORDER — ACETAMINOPHEN 325 MG PO TABS: 650.0000 mg | ORAL_TABLET | ORAL | Status: DC | PRN

## 2020-12-12 MED ORDER — SODIUM CHLORIDE 0.9 % IV BOLUS: 500.0000 mL | Freq: Once | INTRAVENOUS | Status: DC

## 2020-12-12 MED ORDER — BUSPIRONE HCL 5 MG PO TABS
7.5000 mg | ORAL_TABLET | Freq: Two times a day (BID) | ORAL | Status: DC
  Administered 2020-12-13 – 2020-12-14 (×4): 7.5 mg via ORAL
  Filled 2020-12-12 (×6): qty 1.5

## 2020-12-12 MED ORDER — SIMETHICONE 80 MG PO CHEW
125.0000 mg | CHEWABLE_TABLET | Freq: Three times a day (TID) | ORAL | Status: DC
  Administered 2020-12-12 – 2020-12-13 (×3): 120 mg via ORAL
  Filled 2020-12-12 (×5): qty 2

## 2020-12-12 MED ORDER — ONDANSETRON HCL 4 MG PO TABS: 4.0000 mg | ORAL_TABLET | Freq: Three times a day (TID) | ORAL | Status: DC | PRN

## 2020-12-12 NOTE — ED Provider Notes (Signed)
[facility] DEPT Provider Note   CSN: [id number] Arrival date & time: 12/12/20  7824     History No chief complaint on file.   Yan Pankratz is a 87 y.o. male.  HPI Patient presents with concern of swelling, fatigue. Patient has multiple medical issues, lives in a nursing facility. Per EMS, staff at the facility became concerned about swelling in his distal extremities, and he was sent here for evaluation.  Patient offers mixed history of his recent health, seemingly describes to have discomfort in his upper chest described swelling.  Patient does have a history of dementia, level 5 caveat. No report of fever, vomiting, falls.     Past Medical History:  Diagnosis Date  . Bipolar 1 disorder ([place])   . BPH (benign prostatic hyperplasia)   . Cerebral hemorrhage ([place])   . CKD (chronic kidney disease)   . Dementia ([place])   . Depression   . Liver cancer ([place])   . Lung cancer ([place])   . Paranoid ([place])   . Situational anxiety   . Skin cancer    basal cell    There are no problems to display for this patient.   Past Surgical History:  Procedure Laterality Date  . APPENDECTOMY    . head surgery  2017   to release fluid in head s/p fall  . TONSILLECTOMY         Family History  Problem Relation Age of Onset  . Alzheimer's disease Sister     Social History   Tobacco Use  . Smoking status: Former Smoker    Quit date: 09/15/2010    Years since quitting: 10.2  . Smokeless tobacco: Never Used  Substance Use Topics  . Alcohol use: Not Currently  . Drug use: Never    Home Medications Prior to Admission medications   Medication Sig Start Date End Date Taking? Authorizing Provider  diphenhydrAMINE (BENADRYL) 50 MG tablet Take 1 tablet (50 mg total) by mouth once for 1 dose. Take 1 tablet one hour before the CT scan 09/17/19 09/17/19  Donnie Mesa, MD  LORazepam (ATIVAN) 1 MG tablet Take by mouth. Patient not taking: Reported on 09/15/2020     [provider]  neomycin-polymyxin-hydrocortisone (CORTISPORIN) 3.5-10000-1 OTIC suspension Apply 1-2 drops daily after soaking and cover with bandaid Patient not taking: Reported on 09/15/2020 06/24/20   Criselda Peaches, DPM    Allergies    Sulfa antibiotics, Sulfamethoxazole, and Elemental sulfur  Review of Systems   Review of Systems  Unable to perform ROS: Dementia    Physical Exam Updated Vital Signs BP (!) 99/53   Pulse 67   Temp (!) 97.4 F (36.3 C) (Axillary)   Resp 16   SpO2 99%   Physical Exam Vitals and nursing note reviewed.  Constitutional:      General: He is not in acute distress.    Appearance: He is well-developed.  HENT:     Head: Normocephalic and atraumatic.  Eyes:     Conjunctiva/sclera: Conjunctivae normal.  Cardiovascular:     Rate and Rhythm: Normal rate and regular rhythm.  Pulmonary:     Effort: Pulmonary effort is normal. No respiratory distress.     Breath sounds: No stridor.  Abdominal:     General: There is no distension.     Comments: Scaphoid abdomen  Genitourinary:    Comments: Minimal swelling penis, no scrotal swelling, discomfort.  No inguinal lesions. Musculoskeletal:     Right lower leg: Edema present.  Left lower leg: Edema present.  Skin:    General: Skin is warm.     Coloration: Skin is pale.  Neurological:     Mental Status: He is alert.     Cranial Nerves: No dysarthria or facial asymmetry.     Motor: Atrophy present.  Psychiatric:     Comments: Withdrawn, impaired memory     ED Results / Procedures / Treatments   Labs (all labs ordered are listed, but only abnormal results are displayed) Labs Reviewed  COMPREHENSIVE METABOLIC PANEL - Abnormal; Notable for the following components:      Result Value   Potassium 3.2 (*)    Glucose, Bld 120 (*)    BUN 75 (*)    Creatinine, Ser 2.46 (*)    Total Protein 5.5 (*)    Albumin 3.2 (*)    AST 74 (*)    ALT 64 (*)    GFR, Estimated 24 (*)    All other  components within normal limits  CBC WITH DIFFERENTIAL/PLATELET - Abnormal; Notable for the following components:   WBC 12.5 (*)    RBC 3.82 (*)    Hemoglobin 11.6 (*)    HCT 35.7 (*)    RDW 15.9 (*)    Platelets 100 (*)    Neutro Abs 11.1 (*)    Lymphs Abs 0.5 (*)    All other components within normal limits  BRAIN NATRIURETIC PEPTIDE - Abnormal; Notable for the following components:   B Natriuretic Peptide 266.6 (*)    All other components within normal limits  URINALYSIS, ROUTINE W REFLEX MICROSCOPIC - Abnormal; Notable for the following components:   Glucose, UA 150 (*)    Hgb urine dipstick MODERATE (*)    Protein, ur 30 (*)    All other components within normal limits  RESP PANEL BY RT-PCR (FLU A&B, COVID) [id number]    EKG EKG Interpretation  Date/Time:  Sunday December 12 2020 10:40:09 EDT Ventricular Rate:  70 PR Interval:    QRS Duration: 105 QT Interval:  522 QTC Calculation: 564 R Axis:   72 Text Interpretation: Atrial fibrillation Anterior infarct, old Nonspecific T abnormalities, lateral leads Prolonged QT interval Artifact in lead(s) V1 V2 V3 Abnormal ECG Confirmed by Carmin Muskrat [phone]) on 12/12/2020 10:47:03 AM   Radiology CT Head Wo Contrast  Result Date: 12/12/2020 CLINICAL DATA:  Altered mental status/confusion. History of lung carcinoma EXAM: CT HEAD WITHOUT CONTRAST TECHNIQUE: Contiguous axial images were obtained from the base of the skull through the vertex without intravenous contrast. COMPARISON:  September 14, 2020 FINDINGS: Brain: There is moderate diffuse atrophy. There is no intracranial mass or acute hemorrhage. There is thickening along the right frontal dura with thickness in this area measuring 5 mm, stable. This appearance is stable and may represent residua of prior small subdural hematoma in this area. There is no appreciable mass effect in this area. There is suspected calcification within this dural thickening, unchanged from previous study. There is  no acute appearing extra-axial fluid collection. No midline shift. There is decreased attenuation in the centra semiovale bilaterally, stable. No acute appearing infarct is evident. Vascular: No hyperdense vessel. There is calcification in the distal left vertebral artery and in each carotid siphon region. Skull: Patient has had previous frontal craniotomies bilaterally. Bony defects in these areas are stable. Bony calvarium otherwise intact and unchanged in appearance. Sinuses/Orbits: Visualized paranasal sinuses are clear. Orbits appear symmetric bilaterally. Other: Mastoid air cells are clear. IMPRESSION: Stable atrophy with periventricular  small vessel disease. Chronic right frontal subdural thickening with suspected mild calcification, unchanged from prior study and likely due to previous small subdural hematoma with residual fibrosis. No mass effect in this area. No new extra-axial soft tissue thickening. No subdural or epidural acute fluid collections. No hemorrhage or mass. Postoperative changes noted in each frontal calvarium. Foci of arterial vascular calcification noted. Electronically Signed   By: Lowella Grip III M.D.   On: 12/12/2020 12:51   DG Chest Port 1 View  Result Date: 12/12/2020 CLINICAL DATA:  Shortness of breath. History of lung and liver cancer. EXAM: PORTABLE CHEST 1 VIEW COMPARISON:  None. FINDINGS: The heart size is normal. Vascular calcifications are seen in the aortic arch. The lungs are hyperinflated. There is no focal consolidation, pleural effusion, or pneumothorax. Degenerative changes are seen in the spine. IMPRESSION: Hyperinflated lungs suggestive of chronic obstructive pulmonary disease. Aortic Atherosclerosis (ICD10-I70.0). Electronically Signed   By: Zerita Boers M.D.   On: 12/12/2020 10:55    Procedures Procedures   Medications Ordered in ED Medications  sodium chloride 0.9 % bolus 500 mL (0 mLs Intravenous Stopped 12/12/20 1241)  sodium chloride 0.9 % bolus  500 mL (0 mLs Intravenous Stopped 12/12/20 1347)    ED Course  I have reviewed the triage vital signs and the nursing notes.  Pertinent labs & imaging results that were available during my care of the patient were reviewed by me and considered in my medical decision making (see chart for details).  Cardiac monitor 70s A. fib abnormal Pulse ox 98% room air normal Patient is mildly hypotensive on arrival, fluid resuscitation started empirically.    Patient in no distress.  4:00 PM I discussed all updates with the patient's daughters, aware of dehydration, pending urinalysis, COVID test.  Patient does have mild leukocytosis, his blood pressure is improved, low suspicion for bacteremia, sepsis.  However, with worsening confusion, we are awaiting COVID test.  I have paged our social workers to assist with likely need of increased level of care.  Patient is on assisted living, may require nursing care.  Final Clinical Impression(s) / ED Diagnoses Final diagnoses:  Confusion  Dehydration    Rx / DC Orders ED Discharge Orders    None       Carmin Muskrat, MD 12/12/20 1601

## 2020-12-12 NOTE — Progress Notes (Signed)
..  Transition of Care [facility]) - Emergency Department Mini Assessment   Patient Details  Name: George Cross MRN: [id number] Date of Birth: 04-25-1923  [facility] CM/SW Contact:    [name], [place] Phone Number: 12/12/2020, 4:36 PM   Clinical Narrative: TOC CSW spoke with Judeen Hammans, RN at [facility].  Judeen Hammans would have Admin give a return call in the morning.  Pt cld return if SNF is not the recommendation.  Pt was currently being placed in [place] at [facility].  CSW will follow up with Admin at [facility] 12/12/2020 they aren't there today.  Pooja Camuso Tarpley-Carter, MSW, LCSW-A Pronouns:  She, Her, Hers                  [facility] ED Transitions of CareClinical Social Worker [email or web address] [phone]   ED Mini Assessment: What brought you to the Emergency Department? : No chief complaint on file  Barriers to Discharge: No Barriers Identified     Means of departure: Not know  Interventions which prevented an admission or readmission: SNF Placement    Patient Contact and Communications Key Contact 1: Judeen Hammans, RN   Spoke with: Judeen Hammans, RN at [facility] Date: 12/12/20,       Call outcome: Judeen Hammans would have Admin give a return call tomorrow morning.      Choice offered to / list presented to : NA  Admission diagnosis:  penile swelling There are no problems to display for this patient.  PCP:  London Pepper, MD Pharmacy:   [facility], [place] - [place] [address] AT [place] [place] [address] Phone: [phone] Fax: [phone]

## 2020-12-12 NOTE — ED Notes (Signed)
Pt bed pads changed and more blankets provided. Pt provided water and fed mashed potatoes. Pt only took a couple of bites and stated "that's all I can handle"

## 2020-12-12 NOTE — ED Triage Notes (Signed)
Patient here with c/o confusion and increase  penile edema. Hx of dementia BP 88/64 initial up to 116/82.

## 2020-12-12 NOTE — ED Notes (Signed)
Pt placed on male primofit. Suction set to 85 mmHg. Pt daughter at bedside.

## 2020-12-13 ENCOUNTER — Encounter (HOSPITAL_COMMUNITY): Payer: Self-pay

## 2020-12-13 MED ORDER — SALINE SPRAY 0.65 % NA SOLN: 1.0000 | Freq: Once | NASAL | Status: DC

## 2020-12-13 MED ORDER — SALINE SPRAY 0.65 % NA SOLN
1.0000 | NASAL | Status: DC | PRN
  Filled 2020-12-13: qty 44

## 2020-12-13 MED ORDER — SODIUM CHLORIDE 0.9 % IV BOLUS
250.0000 mL | Freq: Once | INTRAVENOUS | Status: AC
  Administered 2020-12-13: 250 mL via INTRAVENOUS

## 2020-12-13 MED ORDER — SIMETHICONE 80 MG PO CHEW
120.0000 mg | CHEWABLE_TABLET | Freq: Three times a day (TID) | ORAL | Status: DC
  Administered 2020-12-13 – 2020-12-14 (×2): 120 mg via ORAL
  Filled 2020-12-13 (×5): qty 2

## 2020-12-13 NOTE — ED Notes (Signed)
Oral temp attempted x2, axillary x1

## 2020-12-13 NOTE — Discharge Instructions (Addendum)
Continue your current medications.  Follow-up with a primary care doctor as soon as possible for further care and treatment.  Impression and recommendations from speech-language pathology, as follows: Clinical Impression   Patient presents with what appears to be a mild-mod oral dysphagia and mild pharyngeal dysphagia with likely impact from cognitive deficit (moderate dementia). Patient's teeth are in very poor quality and this, along with cognitive deficits, results in continous mastication of regular solids and soft solids with significant oral delays, oral residuals post initial swallows. Patient tolerated thin liquids via straw sips which he was able to manage without assistance. No overt coughing or throat clearing observed however he did present with suspected swallow initiation delay. SLP is recommending downgrade diet to Dys 1 thin liquids and will follow patient for ability to advance with solid textures. SLP Visit Diagnosis: Dysphagia, unspecified (R13.10)   Patient will need ongoing evaluation and assessment by social work and care management at his facility.  DNR paperwork has been completed.

## 2020-12-13 NOTE — NC FL2 (Signed)
[place] LEVEL OF CARE SCREENING TOOL     IDENTIFICATION  Patient Name: George Cross Birthdate: [birth year] Sex: male Admission Date (Current Location): 12/12/2020  [facility] and [place] Number:  Herbalist and Address:  [facility],  [place] [address], [place]      Provider Number: [phone]  Attending Physician Name and Address:  Default, Provider, MD  Relative Name and Phone Number:  Katharine Look Sprinkle/Daughter   [phone]    Current Level of Care: Hospital Recommended Level of Care: Other (Comment) (Hospice) Prior Approval Number:    Date Approved/Denied:   PASRR Number: Pending  Discharge Plan: Other (Comment) (Hospice)    Current Diagnoses: There are no problems to display for this patient.   Orientation RESPIRATION BLADDER Height & Weight     Self  Other (Comment) (Dysphagia) Continent Weight:   Height:     BEHAVIORAL SYMPTOMS/MOOD NEUROLOGICAL BOWEL NUTRITION STATUS      Continent Diet (Puree)  AMBULATORY STATUS COMMUNICATION OF NEEDS Skin   Limited Assist Verbally Normal                       Personal Care Assistance Level of Assistance  Bathing,Feeding,Dressing Bathing Assistance: Limited assistance Feeding assistance: Independent Dressing Assistance: Limited assistance     Functional Limitations Info  Sight,Hearing,Speech Sight Info: Adequate Hearing Info: Adequate Speech Info: Adequate    SPECIAL CARE FACTORS FREQUENCY  PT (By licensed PT),OT (By licensed OT)     PT Frequency: 5x times a week OT Frequency: 5x times a week            Contractures Contractures Info: Not present    Additional Factors Info  Code Status,Allergies Code Status Info: DNR Allergies Info: Sulfa Antibiotics, Sulfamethoxazole, and ELemental Sulfur           Current Medications (12/13/2020):  This is the current hospital active medication list Current Facility-Administered Medications  Medication Dose  Route Frequency Provider Last Rate Last Admin  . acetaminophen (TYLENOL) tablet 650 mg  650 mg Oral Q4H PRN Arnaldo Natal, MD      . busPIRone (BUSPAR) tablet 7.5 mg  7.5 mg Oral BID Carmin Muskrat, MD   7.5 mg at 12/13/20 0925  . cholecalciferol (VITAMIN D) tablet 1,000 Units  1,000 Units Oral Daily Carmin Muskrat, MD   1,000 Units at 12/13/20 [phone]  . escitalopram (LEXAPRO) tablet 5 mg  5 mg Oral Daily Carmin Muskrat, MD   5 mg at 12/13/20 0254  . LORazepam (ATIVAN) tablet 0.5 mg  0.5 mg Oral Q6H PRN Carmin Muskrat, MD      . ondansetron [facility]) tablet 4 mg  4 mg Oral Q8H PRN Arnaldo Natal, MD      . oxybutynin (DITROPAN-XL) 24 hr tablet 5 mg  5 mg Oral QHS Carmin Muskrat, MD   5 mg at 12/13/20 0049  . simethicone (MYLICON) chewable tablet 120 mg  120 mg Oral TID Carmin Muskrat, MD      . sodium chloride (OCEAN) 0.65 % nasal spray 1 spray  1 spray Each Nare PRN Dorie Rank, MD       Current Outpatient Medications  Medication Sig Dispense Refill  . busPIRone (BUSPAR) 5 MG tablet Take 7.5 mg by mouth 2 (two) times daily.    Marland Kitchen escitalopram (LEXAPRO) 5 MG tablet Take 5 mg by mouth daily.    Marland Kitchen LORazepam (ATIVAN) 0.5 MG tablet Take by mouth every 6 (six) hours as  needed for anxiety.    . Magnesium Hydroxide (BL MILK OF MAGNESIA PO) Take 30 mLs by mouth daily as needed (constipation).    Marland Kitchen oxybutynin (DITROPAN-XL) 5 MG 24 hr tablet Take 5 mg by mouth at bedtime.    . simethicone (MYLICON) 403 MG chewable tablet Chew 125 mg by mouth in the morning, at noon, and at bedtime.    . Vitamin D3 (VITAMIN D) 25 MCG tablet Take 1,000 Units by mouth daily.    . diphenhydrAMINE (BENADRYL) 50 MG tablet Take 1 tablet (50 mg total) by mouth once for 1 dose. Take 1 tablet one hour before the CT scan (Patient not taking: Reported on 12/12/2020) 1 tablet 0     Discharge Medications: Please see discharge summary for a list of discharge medications.  Relevant Imaging Results:  Relevant Lab  Results:   Additional Information SSN #:  [id number]  Ht:  5' 10.5"  Wt:  132 lbs  Nels Munn C Tarpley-Carter, LCSWA

## 2020-12-13 NOTE — Evaluation (Signed)
Physical Therapy Evaluation Patient Details Name: George Cross MRN: [id number] DOB: Dec 14, 1922 Today's Date: 12/13/2020   History of Present Illness  Pt is 87 yo male presented with swelling in distal extremities on 12/12/20.  Pt has dementia and is from a memory care facility - [facility].  Pt has medical hx of dementia, liver CA, lung CA, anxiety.  Clinical Impression   Pt admitted with above diagnosis. Pt is from [facility] , memory care unit.  He is questionable historian due to dementia but reports he performed ADLs with min A and ambulated with rollator independently.  Today, he required min A for transfers with tendency to posterior lean.  He did ambulate 69' with RW today.  Pt currently with functional limitations due to the deficits listed below (see PT Problem List). Pt will benefit from skilled PT to increase their independence and safety with mobility to allow discharge to the venue listed below.       Follow Up Recommendations Other (comment) (Return to his memory care facility, [facility], if able to provide min A.  If not , will need SNF to progress)    Equipment Recommendations       Recommendations for Other Services       Precautions / Restrictions Precautions Precautions: Fall      Mobility  Bed Mobility Overal bed mobility: Needs Assistance Bed Mobility: Supine to Sit;Sit to Supine     Supine to sit: Min assist;HOB elevated Sit to supine: Min assist;HOB elevated        Transfers Overall transfer level: Needs assistance Equipment used: Rolling walker (2 wheeled) Transfers: Sit to/from Stand Sit to Stand: Min assist         General transfer comment: Increased time with use of extending knees/pushing back into bed to stabilize initially; also min A to steady  Ambulation/Gait Ambulation/Gait assistance: Min assist Gait Distance (Feet): 75 Feet   Gait Pattern/deviations: Step-to pattern;Decreased stride length;Trunk flexed Gait velocity: decreased    General Gait Details: Min A initially for balance progressing to min guard; cues for RW - pt used to Set designer    Modified Rankin (Stroke Patients Only)       Balance Overall balance assessment: Needs assistance   Sitting balance-Leahy Scale: Good     Standing balance support: Bilateral upper extremity supported;No upper extremity supported Standing balance-Leahy Scale: Poor Standing balance comment: Initially with postierior lean requiring min A but able to progress to RW and min guard; did remove hands briefly to use sanitizer                             Pertinent Vitals/Pain Pain Assessment: No/denies pain Faces Pain Scale: No hurt    Home Living Family/patient expects to be discharged to:: Other (Comment)               Home Equipment: Gilford Rile - 4 wheels Additional Comments: Return to [place] or SNF    Prior Function Level of Independence: Needs assistance   Gait / Transfers Assistance Needed: Ambulates with rollator - reports on his own  ADL's / Homemaking Assistance Needed: Has assist with ADL  Comments: questionable historian     Hand Dominance        Extremity/Trunk Assessment   Upper Extremity Assessment Upper Extremity Assessment: Overall WFL for tasks assessed    Lower Extremity Assessment Lower Extremity Assessment: Overall [facility]  for tasks assessed    Cervical / Trunk Assessment Cervical / Trunk Assessment: Other exceptions Cervical / Trunk Exceptions: somewhat cachetic appearance  Communication      Cognition Arousal/Alertness: Awake/alert Behavior During Therapy: WFL for tasks assessed/performed Overall Cognitive Status: No family/caregiver present to determine baseline cognitive functioning                                 General Comments: Pt has history of dementia.  He was oriented to self , year, and season.  States he is at [employer] [facility] (where he lives) easily  oriented to hospital.  Followed basic commands.      General Comments      Exercises     Assessment/Plan    PT Assessment Patient needs continued PT services  PT Problem List Decreased strength;Decreased mobility;Decreased safety awareness;Decreased coordination;Decreased activity tolerance;Decreased balance;Decreased knowledge of use of DME       PT Treatment Interventions DME instruction;Therapeutic exercise;Gait training;Balance training;Stair training;Functional mobility training;Therapeutic activities;Patient/family education;Neuromuscular re-education    PT Goals (Current goals can be found in the Care Plan section)  Acute Rehab PT Goals Patient Stated Goal: get warm PT Goal Formulation: With patient Time For Goal Achievement: 12/27/20 Potential to Achieve Goals: Good    Frequency Min 3X/week   Barriers to discharge        Co-evaluation               AM-PAC PT "6 Clicks" Mobility  Outcome Measure Help needed turning from your back to your side while in a flat bed without using bedrails?: A Little Help needed moving from lying on your back to sitting on the side of a flat bed without using bedrails?: A Little Help needed moving to and from a bed to a chair (including a wheelchair)?: A Little Help needed standing up from a chair using your arms (e.g., wheelchair or bedside chair)?: A Little Help needed to walk in hospital room?: A Little Help needed climbing 3-5 steps with a railing? : A Little 6 Click Score: 18    End of Session Equipment Utilized During Treatment: Gait belt Activity Tolerance: Patient tolerated treatment well Patient left: in bed;with call bell/phone within reach;with bed alarm set Nurse Communication: Mobility status PT Visit Diagnosis: Unsteadiness on feet (R26.81);Muscle weakness (generalized) (M62.81)    Time: 2876-8115 PT Time Calculation (min) (ACUTE ONLY): 17 min   Charges:   PT Evaluation $PT Eval Low Complexity: 1 Low           Corin Tilly, PT Acute Rehab Services Pager [phone] [facility] [phone]    Karlton Lemon 12/13/2020, 12:08 PM

## 2020-12-13 NOTE — ED Notes (Signed)
Gave pt morning meds in applesauce and fed him the entire container of applesauce.

## 2020-12-13 NOTE — ED Notes (Signed)
Peri-care provided, new primo-fit applied. Sacral dressing applied to prevent skin breakdown, pillow placed on R side to help with pressure. Dinner and warm blankets provided.

## 2020-12-13 NOTE — Progress Notes (Signed)
TOC CSW spoke with Varney Biles, RN/[facility] at [place].  CSW wanted to ensure that information requested had been received.  Information was faxed to [phone].  Admin and Varney Biles are meeting with family at 6:30pm tonight.  Varney Biles can accept pt tomorrow morning and will update CSW on meeting with family.  Regena Delucchi Tarpley-Carter, MSW, LCSW-A Pronouns:  She, Her, Hers                  [place] ED Transitions of CareClinical Social Worker [email or web address] [phone]

## 2020-12-13 NOTE — ED Notes (Signed)
Patient is asleep in bed. Patient is kind and pleasantly confused at times. Patient was able to take his medications crushed with applesauce.

## 2020-12-13 NOTE — ED Notes (Signed)
Patient attempting to get out of bed to urinate, reminded about external urinary catheter and placed back in bed. Bed alarm placed.

## 2020-12-13 NOTE — ED Notes (Signed)
Pt provided a cup of applesauce and water. Pt's hair combed and blankets provided

## 2020-12-13 NOTE — Progress Notes (Addendum)
TOC CSW has spoken with Rosiland Oz daughter [phone].  CSW shared information that was obtained from [place], [place] in reference to pt.  Varney Biles, RN feels that [place] [facility] [place] can accommodate pt and his current disposition.  [facility] can accommodate pt returning with min assistance or to hospice at recommended by Varney Biles, [place] [facility].    Dedra Matsuo Tarpley-Carter, MSW, LCSW-A Pronouns:  She, Her, Hers                  [place] ED Transitions of CareClinical Social Worker [email or web address] [phone]

## 2020-12-13 NOTE — ED Notes (Signed)
Patient was given his lunch tray.

## 2020-12-13 NOTE — Evaluation (Signed)
Clinical/Bedside Swallow Evaluation Patient Details  Name: Braelon Sprung MRN: [id number] Date of Birth: April 14, 1923  Today's Date: 12/13/2020 Time: SLP Start Time (ACUTE ONLY): 1045 SLP Stop Time (ACUTE ONLY): 1115 SLP Time Calculation (min) (ACUTE ONLY): 30 min  Past Medical History:  Past Medical History:  Diagnosis Date  . Bipolar 1 disorder ([place])   . BPH (benign prostatic hyperplasia)   . Cerebral hemorrhage ([place])   . CKD (chronic kidney disease)   . Dementia ([place])   . Depression   . Liver cancer ([place])   . Lung cancer ([place])   . Paranoid ([place])   . Situational anxiety   . Skin cancer    basal cell   Past Surgical History:  Past Surgical History:  Procedure Laterality Date  . APPENDECTOMY    . head surgery  2017   to release fluid in head s/p fall  . TONSILLECTOMY     HPI:  Patient is a 87 y.o. male with PMH: dementia, bipolar disorder, BPH, cerebral hemorrhage, CKD, liver cancer, lung cancer, paranoid, situational anxiety, skin cancer. He is a resident at [facility] in [facility]. He presented to ED after staff at SNF became concerned about swelling in his distal extremities.   Assessment / Plan / Recommendation Clinical Impression  Patient presents with what appears to be a mild-mod oral dysphagia and mild pharyngeal dysphagia with likely impact from cognitive deficit (moderate dementia). Patient's teeth are in very poor quality and this, along with cognitive deficits, results in continous mastication of regular solids and soft solids with significant oral delays, oral residuals post initial swallows. Patient tolerated thin liquids via straw sips which he was able to manage without assistance. No overt coughing or throat clearing observed however he did present with suspected swallow initiation delay. SLP is recommending downgrade diet to Dys 1 thin liquids and will follow patient for ability to advance with solid textures. SLP Visit Diagnosis: Dysphagia, unspecified  (R13.10)    Aspiration Risk  Mild aspiration risk    Diet Recommendation Dysphagia 1 (Puree);Thin liquid   Liquid Administration via: Cup;Straw Medication Administration: Crushed with puree Supervision: Patient able to self feed;Intermittent supervision to cue for compensatory strategies Compensations: Minimize environmental distractions;Slow rate;Small sips/bites Postural Changes: Seated upright at 90 degrees    Other  Recommendations Oral Care Recommendations: Oral care BID;Staff/trained caregiver to provide oral care   Follow up Recommendations 24 hour supervision/assistance;Skilled Nursing facility      Frequency and Duration min 1 x/week  1 week       Prognosis Prognosis for Safe Diet Advancement: Fair Barriers to Reach Goals: Time post onset;Other (Comment);Cognitive deficits Barriers/Prognosis Comment: Patient with h/o moderate dementia      Swallow Study   General Date of Onset: 12/12/20 HPI: Patient is a 87 y.o. male with PMH: dementia, bipolar disorder, BPH, cerebral hemorrhage, CKD, liver cancer, lung cancer, paranoid, situational anxiety, skin cancer. He is a resident at [place] in [facility]. He presented to ED after staff at SNF became concerned about swelling in his distal extremities. Type of Study: Bedside Swallow Evaluation Previous Swallow Assessment: None found Diet Prior to this Study: Regular;Thin liquids Temperature Spikes Noted: No Respiratory Status: Room air History of Recent Intubation: No Behavior/Cognition: Alert;Cooperative;Pleasant mood Oral Cavity Assessment: Within Functional Limits Oral Care Completed by SLP: No Oral Cavity - Dentition: Poor condition;Missing dentition Vision: Functional for self-feeding Self-Feeding Abilities: Able to feed self Patient Positioning: Upright in bed Baseline Vocal Quality: Normal Volitional Cough: Cognitively  unable to elicit Volitional Swallow: Unable to elicit    Oral/Motor/Sensory Function  Overall Oral Motor/Sensory Function: Within functional limits   Ice Chips     Thin Liquid Thin Liquid: Impaired Presentation: Straw;Self Fed Oral Phase Functional Implications: Prolonged oral transit Pharyngeal  Phase Impairments: Suspected delayed Swallow    Nectar Thick     Honey Thick     Puree Puree: Impaired Oral Phase Functional Implications: Prolonged oral transit   Solid     Solid: Impaired Oral Phase Impairments: Impaired mastication Oral Phase Functional Implications: Impaired mastication;Oral residue;Prolonged oral transit      Sonia Baller, MA, CCC-SLP Speech Therapy

## 2020-12-14 DIAGNOSIS — R531 Weakness: Secondary | ICD-10-CM | POA: Diagnosis not present

## 2020-12-14 DIAGNOSIS — G8911 Acute pain due to trauma: Secondary | ICD-10-CM | POA: Diagnosis not present

## 2020-12-14 DIAGNOSIS — R5381 Other malaise: Secondary | ICD-10-CM | POA: Diagnosis not present

## 2020-12-14 NOTE — ED Notes (Signed)
Pt given meal tray.

## 2020-12-14 NOTE — ED Notes (Signed)
Crushed pt's meds and put in applesauce, pt tolerated crushed pills well, now resting in bed, chest rising and falling, pt in NAD

## 2020-12-14 NOTE — ED Notes (Signed)
Daughter is aware that he is being transported to [place].

## 2020-12-14 NOTE — Progress Notes (Signed)
TOC CSW received a call from Audrea Muscat [place]/[facility] in regards to pt and hospice.  Audrea Muscat stated she would reach out to [place], Therapist, sports and contact CSW with an update after speaking with Varney Biles, Therapist, sports at [place] at [place].  Cora Stetson Tarpley-Carter, MSW, LCSW-A Pronouns:  She, Her, [place] ED Transitions of CareClinical Social Worker [email or web address] [phone]

## 2020-12-14 NOTE — ED Notes (Signed)
Per SW, facility wants hospital bed to be delivered there prior to pt going back. SW states bed should hopefully be delivered within 2 hours and facility stated they would contact us when bed is there.

## 2020-12-14 NOTE — ED Notes (Addendum)
Daughter Harland Dingwall would like the RN or SW to call her with an [phone]

## 2020-12-14 NOTE — ED Notes (Signed)
PTAR is here.

## 2020-12-14 NOTE — ED Notes (Signed)
Pt resting in bed comfortably, chest rising and falling, pt in NAD

## 2020-12-14 NOTE — Progress Notes (Signed)
Manufacturing engineer [facility]) Hospital Liaison: RN note    Notified by Transition of Care Manger of patient/family request for [facility] services at home after discharge to [facility]. Chart and patient information under review by [facility] physician. Hospice eligibility pending currently.    Writer spoke with daughter, Katharine Look  to initiate education related to hospice philosophy, services and team approach to care.  Katharine Look verbalized understanding of information given. Per discussion, plan is for discharge to [facility] by PTAR when DME is in place.   Please send signed and completed DNR form home with patient/family. Patient will need prescriptions for discharge comfort medications.     DME needs have been discussed, patient currently has the following equipment in the home: none.  [facility] requests the following DME for delivery to the home: hospital bed.  [place] equipment manager has been notified and will contact DME provider to arrange delivery to the home. Home address has been verified and is correct in the chart.  [facility] aware of the above. Please notify [facility] when patient is ready to leave the unit at discharge. (Call [phone] or [phone] after 5pm.)   A Please do not hesitate to call with questions.    Thank you,   Farrel Gordon, RN, [place] Hospital Liaison  [phone]

## 2020-12-14 NOTE — ED Notes (Signed)
Pt resting in bed, chest rising and falling, pt in NAD

## 2020-12-14 NOTE — Progress Notes (Signed)
TOC CSW contacted EDP in regards to pts dc plan.   Shalla Bulluck Tarpley-Carter, MSW, LCSW-A Pronouns:  She, Her, Hers                  [place] ED Transitions of CareClinical Social Worker [email or web address] [phone]

## 2020-12-14 NOTE — ED Notes (Signed)
Went to check on patient, he is asleep in room.

## 2020-12-14 NOTE — Progress Notes (Signed)
TOC CSW contacted George Cross/[facility] [phone] in regards to progress with pts care.  George Cross stated he was unsure, but he would pass along my information to George Cross and have her contact CSW.    George Cross, MSW, LCSW-A Pronouns:  She, Her, [place] ED Transitions of CareClinical Social Worker [email or web address] [phone]

## 2020-12-14 NOTE — ED Notes (Signed)
Pt received meal tray

## 2020-12-14 NOTE — Progress Notes (Signed)
TOC CSW  [employer], RN/[facility] at [place] in regards to pt returning to [facility].  Varney Biles stated she has been in contact with Lennette Bihari [place]/[facility] [phone].  CSW disclose she would contact Lennette Bihari for an update on progress.  Efrat Zuidema Tarpley-Carter, MSW, LCSW-A Pronouns:  She, Her, Hers                  [place] ED Transitions of CareClinical Social Worker [email or web address] [phone]

## 2020-12-14 NOTE — ED Provider Notes (Signed)
Emergency Medicine Observation Re-evaluation Note  George Cross is a 87 y.o. male, seen on rounds today.  Pt initially presented to the ED for complaints of No chief complaint on file. Currently, the patient is awaiting for placement by SW.  Physical Exam  BP 110/61   Pulse 60   Temp 97.8 F (36.6 C) (Oral)   Resp 14   SpO2 97%  Physical Exam General: No acute distress Cardiac: Regular rate Lungs: No respiratory distress Psych: No agitation  ED Course / MDM  EKG:EKG Interpretation  Date/Time:  Sunday December 12 2020 10:40:09 EDT Ventricular Rate:  70 PR Interval:    QRS Duration: 105 QT Interval:  522 QTC Calculation: 564 R Axis:   72 Text Interpretation: Atrial fibrillation Anterior infarct, old Nonspecific T abnormalities, lateral leads Prolonged QT interval Artifact in lead(s) V1 V2 V3 Abnormal ECG Confirmed by Carmin Muskrat [phone]) on 12/12/2020 10:47:03 AM   I have reviewed the labs performed to date as well as medications administered while in observation.  Recent changes in the last 24 hours include no medical needs or escalation in care for.  Continues to require assistance with things like feeding.  Plan  Current plan is for patient to be placed. Patient is not under full IVC at this time.  9:47 AM Clinical social work reached out to me to clarify the disposition. She informs me that, that the assisted living where patient was residing is comfortable taking him back, however there was some discussion of hospice, and some confusion surrounding that.  I have reviewed patient's chart. Dr. Vanita Panda, the primary EDP never mentioned need for hospice acutely. Patient has stable hemodynamics, labs are not showing any imminent danger.  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  I reviewed the physical therapy note from yesterday.  They deemed that he can go back to the  memory unit with minimal assistance, if that is not sustainable then patient will need transfer to SNF.  Speech therapy saw the patient.  Mild concern for aspiration.  Their recommendation was: SLP is recommending downgrade diet to Dys 1 thin liquids and will follow patient for ability to advance with solid textures.  After reviewing the care, I have requested clinical social work to get in touch with the assisted living.  The options are that they will accept the patient and provide minimal support.  There clinical staff and social work team can assess the patient and decide if he needs hospice consultation, with the hope that with extra resources they can keep him at their facility.  Their social work team and clinical team can also work on getting patient transferred to a SNF if assisted living is not a long-term appropriate option.  I have asked social worker to connect with both the facility and the family to close the loop, as the issue at hand is primary social work and not medical at this time. There is no need for emergency hospice consultation.   Varney Biles, MD 12/14/20 [phone]

## 2020-12-14 NOTE — ED Notes (Signed)
Patient is asleep. Wakes up occasionaly to ask the time. Patient was given some water to drink

## 2020-12-14 NOTE — ED Notes (Signed)
Pt pulled off male purewick, wet bed. Cleaned pt, put new sheets and pads down, and put new purewick on pt. Brought pt warm blanket, resting comfortably

## 2020-12-14 NOTE — ED Notes (Signed)
Updated daughter that pt would be going back to [place] at [facility], hospital bed was delivered to SNF, and called [facility] to set up transport

## 2020-12-23 DIAGNOSIS — F1011 Alcohol abuse, in remission: Secondary | ICD-10-CM | POA: Diagnosis not present

## 2020-12-23 DIAGNOSIS — K648 Other hemorrhoids: Secondary | ICD-10-CM | POA: Diagnosis not present

## 2020-12-23 DIAGNOSIS — R627 Adult failure to thrive: Secondary | ICD-10-CM | POA: Diagnosis not present

## 2020-12-23 DIAGNOSIS — M79606 Pain in leg, unspecified: Secondary | ICD-10-CM | POA: Diagnosis not present

## 2020-12-23 DIAGNOSIS — R143 Flatulence: Secondary | ICD-10-CM | POA: Diagnosis not present

## 2020-12-23 DIAGNOSIS — E568 Deficiency of other vitamins: Secondary | ICD-10-CM | POA: Diagnosis not present

## 2021-01-07 DEATH — deceased

## 2021-11-09 IMAGING — CT CT HEAD W/O CM
1 series · 16 of 30 positions shown, 20 images · non-contrast
Comparison: None.

CLINICAL DATA: Visual disturbance, hallucinations. History of prior
intracranial hemorrhage.

EXAM:
CT HEAD WITHOUT CONTRAST
TECHNIQUE: Contiguous axial images were obtained from the base of the skull
through the vertex without intravenous contrast.

[Series 2: head w/(date) · axial · 0.49mm/px · z∈[-166,-12]mm · 16 of 35 slices shown, 20 images]
[im 2/35  brain]
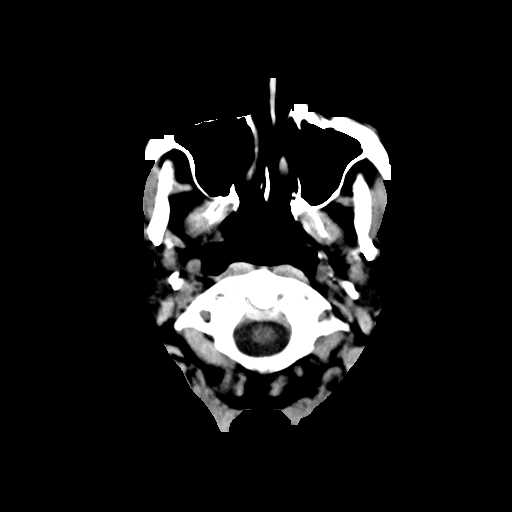
[im 2/35  bone]
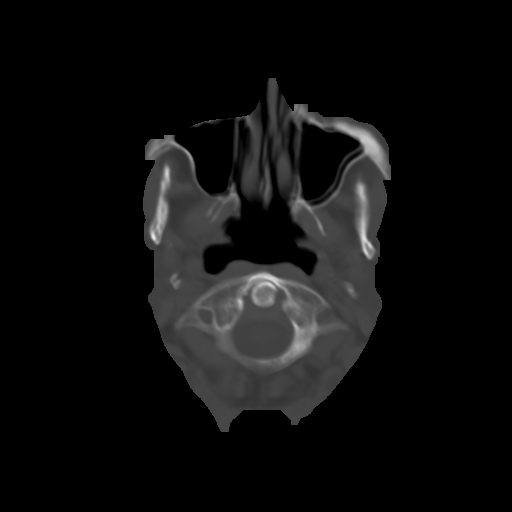
[im 4/35  brain]
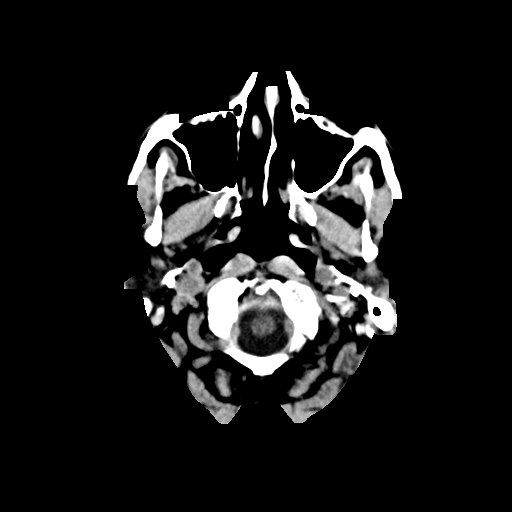
[im 6/35  brain]
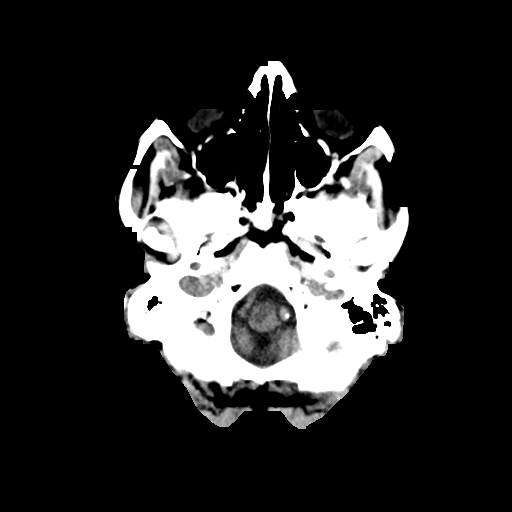
[im 9/35  brain]
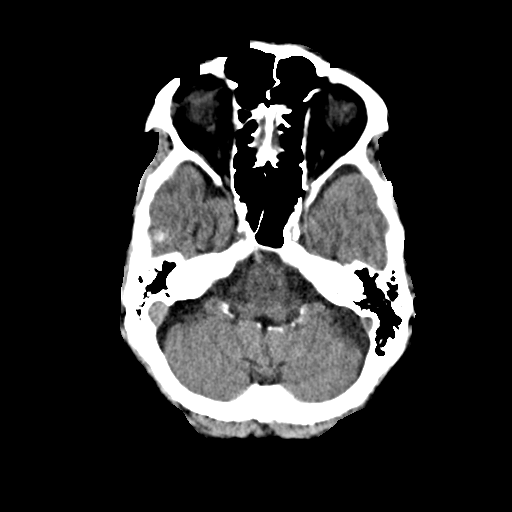
[im 10/35  brain]
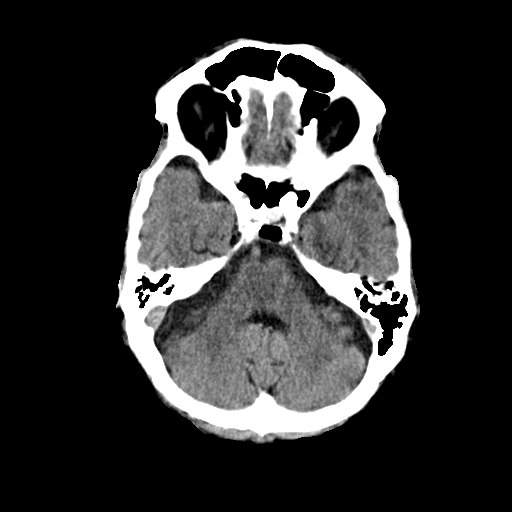
[im 10/35  bone]
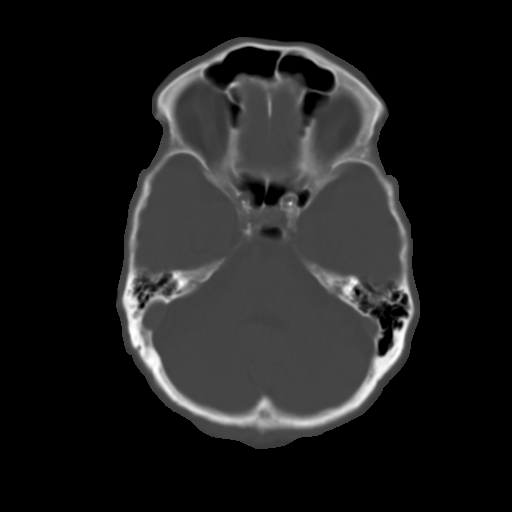
[im 12/35  brain]
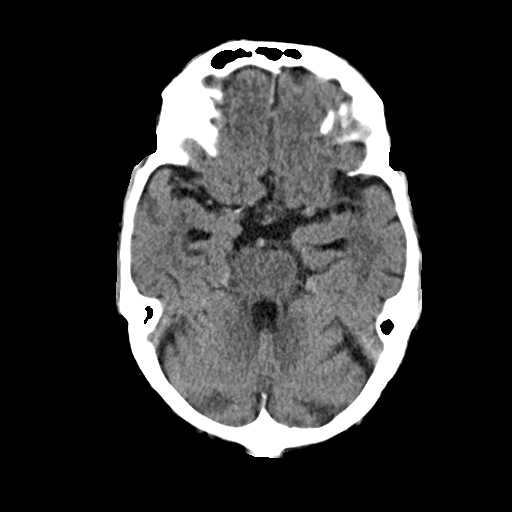
[im 15/35  brain]
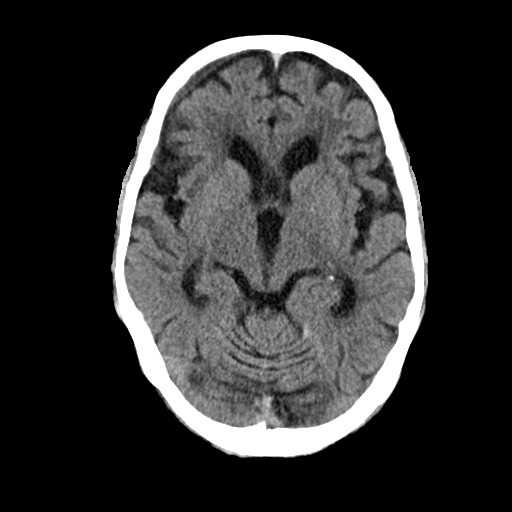
[im 17/35  brain]
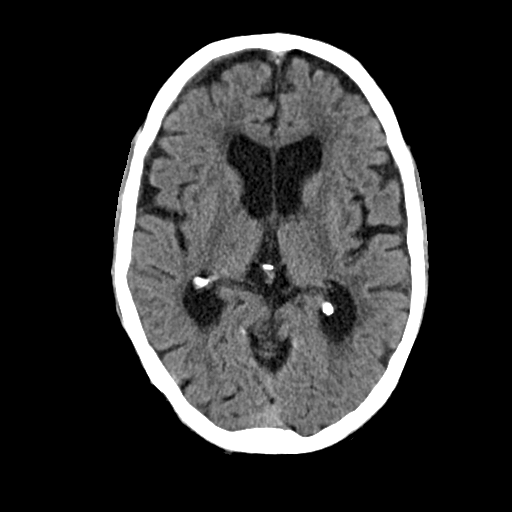
[im 18/35  brain]
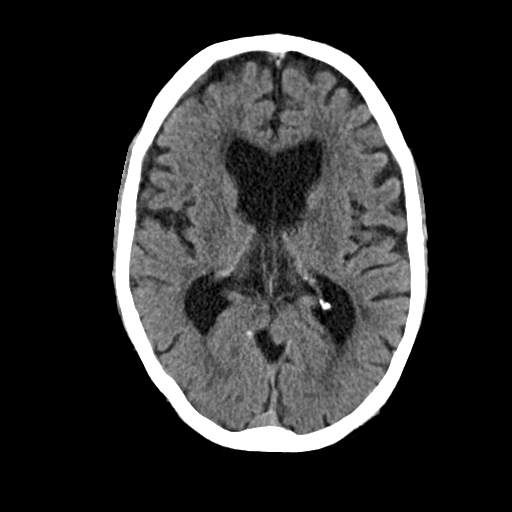
[im 18/35  bone]
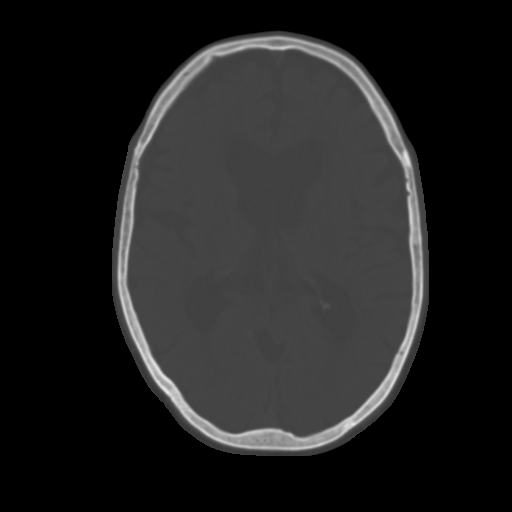
[im 20/35  brain]
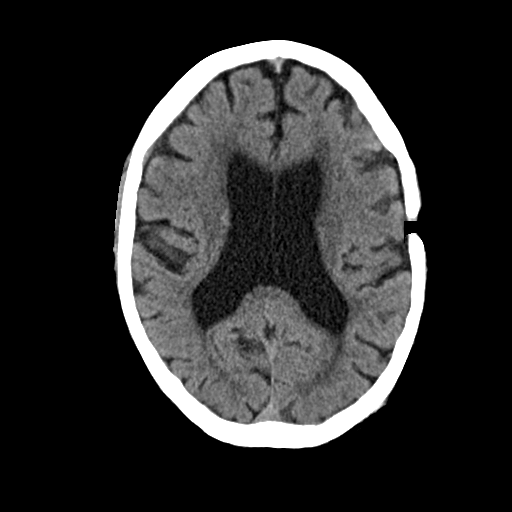
[im 23/35  brain]
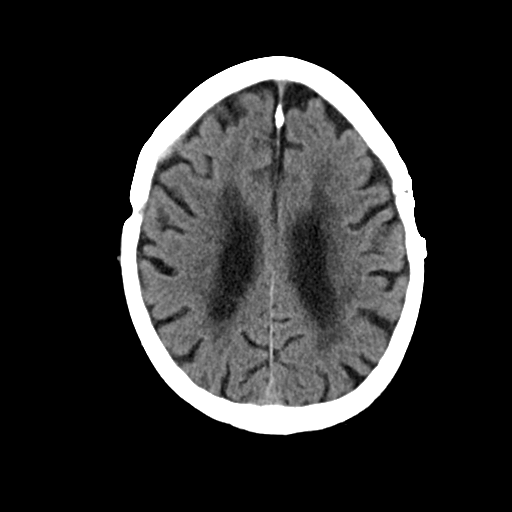
[im 25/35  brain]
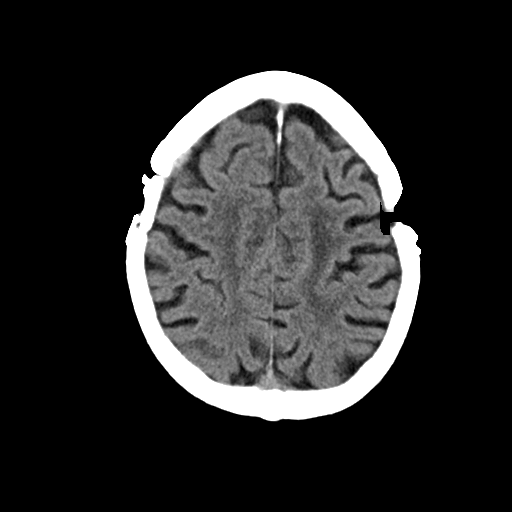
[im 26/35  brain]
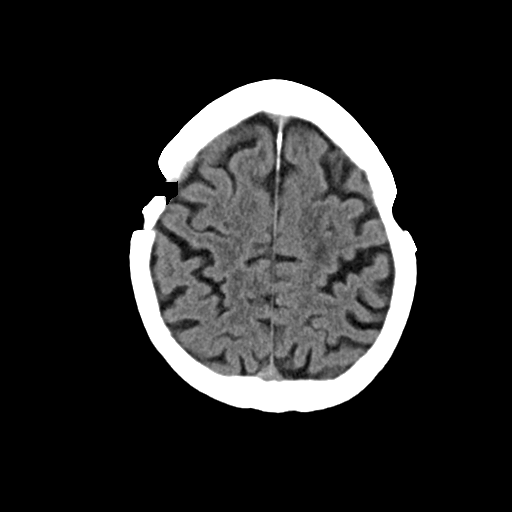
[im 26/35  bone]
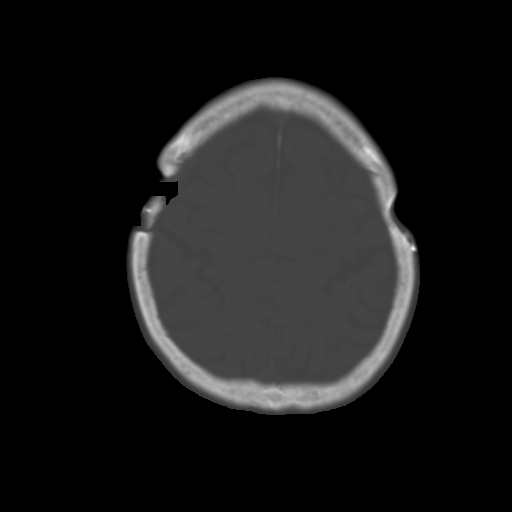
[im 29/35  brain]
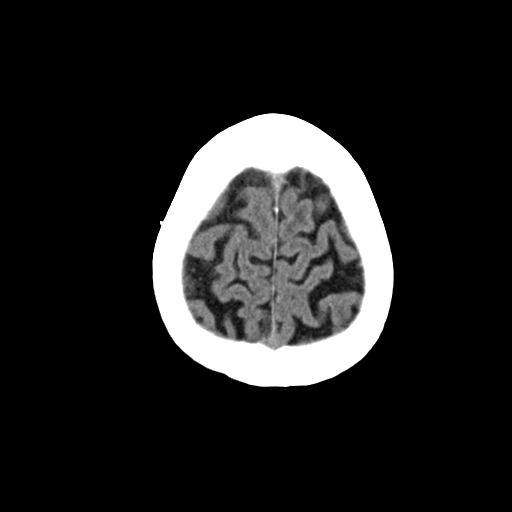
[im 31/35  brain]
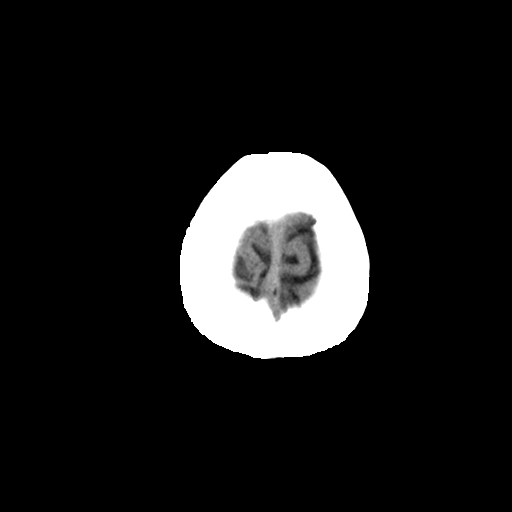
[im 33/35  brain]
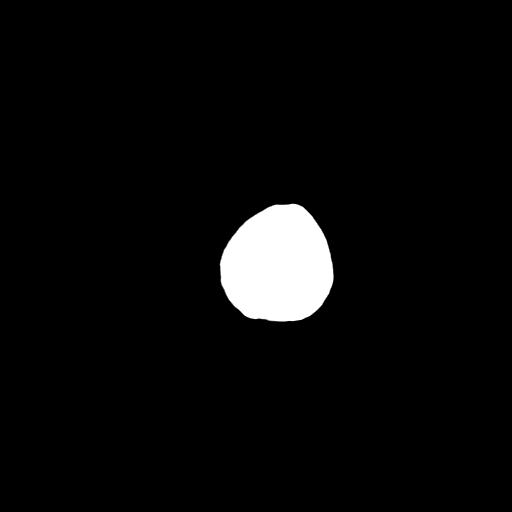

[16 of 30 positions shown; findings below may reference images not displayed]

FINDINGS: Brain: Moderate atrophy. Ventricular enlargement consistent with
atrophy. Patchy white matter hypodensity bilaterally.

Negative for acute infarct, hemorrhage, mass.  No midline shift

Vascular: Negative for hyperdense vessel

Skull: Bilateral craniotomy.  No acute skeletal abnormality

Sinuses/Orbits: Paranasal sinuses clear. Bilateral cataract
extraction

Other: None
IMPRESSION: No acute abnormality. Atrophy and chronic microvascular ischemic
change in the white matter. Bilateral craniotomy.
